# Patient Record
Sex: Male | Born: 1955 | Race: White | Hispanic: No | Smoking: Never smoker
Health system: Southern US, Community
[De-identification: ages and names within clinical notes are randomized; demographics above are authoritative.]

## PROBLEM LIST (undated history)

## (undated) DIAGNOSIS — B1920 Unspecified viral hepatitis C without hepatic coma: Secondary | ICD-10-CM

## (undated) DIAGNOSIS — D369 Benign neoplasm, unspecified site: Secondary | ICD-10-CM

## (undated) DIAGNOSIS — K449 Diaphragmatic hernia without obstruction or gangrene: Secondary | ICD-10-CM

## (undated) DIAGNOSIS — K222 Esophageal obstruction: Secondary | ICD-10-CM

## (undated) DIAGNOSIS — K5730 Diverticulosis of large intestine without perforation or abscess without bleeding: Secondary | ICD-10-CM

## (undated) DIAGNOSIS — I10 Essential (primary) hypertension: Secondary | ICD-10-CM

## (undated) DIAGNOSIS — K219 Gastro-esophageal reflux disease without esophagitis: Secondary | ICD-10-CM

## (undated) DIAGNOSIS — M1990 Unspecified osteoarthritis, unspecified site: Secondary | ICD-10-CM

## (undated) HISTORY — DX: Hereditary hemochromatosis: E83.110

## (undated) HISTORY — DX: Unspecified viral hepatitis C without hepatic coma: B19.20

## (undated) HISTORY — DX: Esophageal obstruction: K22.2

## (undated) HISTORY — PX: LIVER BIOPSY: SHX301

## (undated) HISTORY — DX: Diverticulosis of large intestine without perforation or abscess without bleeding: K57.30

## (undated) HISTORY — PX: VASECTOMY: SHX75

## (undated) HISTORY — DX: Benign neoplasm, unspecified site: D36.9

## (undated) HISTORY — DX: Diaphragmatic hernia without obstruction or gangrene: K44.9

---

## 2004-06-28 ENCOUNTER — Ambulatory Visit: Payer: Self-pay | Admitting: Internal Medicine

## 2004-07-01 ENCOUNTER — Ambulatory Visit: Payer: Self-pay | Admitting: Internal Medicine

## 2004-07-01 ENCOUNTER — Ambulatory Visit (HOSPITAL_COMMUNITY): Admission: RE | Admit: 2004-07-01 | Discharge: 2004-07-01 | Payer: Self-pay | Admitting: Internal Medicine

## 2004-07-01 HISTORY — PX: ESOPHAGOGASTRODUODENOSCOPY: SHX1529

## 2004-07-01 HISTORY — PX: COLONOSCOPY: SHX174

## 2004-07-02 ENCOUNTER — Ambulatory Visit: Payer: Self-pay | Admitting: Internal Medicine

## 2007-07-30 ENCOUNTER — Ambulatory Visit (HOSPITAL_COMMUNITY): Admission: RE | Admit: 2007-07-30 | Discharge: 2007-07-30 | Payer: Self-pay | Admitting: Internal Medicine

## 2007-07-30 ENCOUNTER — Encounter: Payer: Self-pay | Admitting: Internal Medicine

## 2007-07-30 ENCOUNTER — Ambulatory Visit: Payer: Self-pay | Admitting: Internal Medicine

## 2007-07-30 HISTORY — PX: COLONOSCOPY: SHX174

## 2008-06-20 ENCOUNTER — Emergency Department (HOSPITAL_COMMUNITY): Admission: EM | Admit: 2008-06-20 | Discharge: 2008-06-21 | Payer: Self-pay | Admitting: Emergency Medicine

## 2009-10-16 ENCOUNTER — Encounter: Payer: Self-pay | Admitting: Internal Medicine

## 2010-03-09 NOTE — Letter (Signed)
Summary: DISABILITY DETERMINATION  DISABILITY DETERMINATION   Imported By: Rexene Alberts 10/16/2009 13:45:12  _____________________________________________________________________  External Attachment:    Type:   Image     Comment:   External Document

## 2010-06-22 NOTE — H&P (Signed)
[name], Kyle               ACCOUNT NO.:  [id number]   MEDICAL RECORD NO.:  [id number]          PATIENT TYPE:  AMB   LOCATION:  DAY                           FACILITY:  [facility]   PHYSICIAN:  R. Roetta Sessions, M.D. DATE OF BIRTH:  Oct 31, 1955   DATE OF ADMISSION:  07/30/2007  DATE OF DISCHARGE:  LH                              HISTORY & PHYSICAL   Colonoscopy with snare polypectomy and polyp ablation.   INDICATIONS FOR PROCEDURE:  A 55 year old gentleman who has a history of  villous adenomas removed from his colon 3 years ago.  He is here for  surveillance.  He has no lower GI tract symptoms currently.   The patient has iron overload.  We have attempted to get him down to [facility]  for further evaluation of less than three times.  He has not been able  to make it.  He tells me he has been getting phlebotomies under the  direction of Dr. Margo Common.  Dr. Margo Common asked that we can go over with  things in that regard in the near future.  Today, he is undergoing  colonoscopy.  Risks, benefits, alternatives, and limitations have been  reviewed.  Questions answered and is agreeable.  Please see  documentation medical record.   PROCEDURE NOTE:  O2 saturation, blood pressure, pulse, respirations were  monitor throughout the entire procedure.   CONSCIOUS SEDATION:  Versed 5 mg IV and Demerol 75 mg IV, in divided  doses.   INSTRUMENT:  Pentax video chip system.   FINDINGS:  Digital rectal exam revealed no abnormalities.  Endoscopic  findings:  The prep was adequate.  Colon:  Colonic mucosa was surveyed  from the rectosigmoid junction to the left transverse, right colon,  appendiceal orifice, ileocecal valve, and cecum.  From this level, the  scope was slowly withdrawn.  All previous mentioned mucosal surfaces  were again seen.  The patient had an extensive diverticula tapering off  well into the ascending colon.  The diverticula more pronounced on the  left colon.  The patient had a 1 cm polyp  on a long stalk in the mid  ascending colon, which was hot snare removed.  There is a second  diminutive polyp, which was ablated with a tip of the snare cautery in  it in the same segment.  There was a 5 mm polyp in the mid descending,  which was hot snared and finally a diminutive polyp in the mid  descending colon, which was ablated with a tip of the hot snare cautery  in it.  The remaining colonic mucosa appeared normal.  The scope was  pulled down the rectum.  A thorough examination of rectal mucosa  including retroflexion view of the anal verge demonstrated no  abnormalities.  The patient tolerated the procedure well and was reacted  in endoscopy.   IMPRESSION:  1. Normal rectum, pancolonic diverticula.  2. Polyps.  Colonic polyps, as described above, removed and/or ablated      as described above.  Large polyp was a 1 cm polyp in the mid  descending colon.   RECOMMENDATIONS:  1. Diverticulosis.  Polyp literature provided [name].  2. Follow up on path.  3. We will make arrangements to get him back in the office where we      will make further recommendations regarding hemochromatosis.      [name], M.D.  Electronically Signed     RMR/MEDQ  D:  07/30/2007  T:  07/30/2007  Job:  [id number]   cc:   Wyvonnia Lora  Fax: [phone]

## 2010-06-25 NOTE — Op Note (Signed)
NAMEPAULINO, Kyle [name]               ACCOUNT NO.:  [id number]   MEDICAL RECORD NO.:  [id number]          PATIENT TYPE:  AMB   LOCATION:  DAY                           FACILITY:  [facility]   PHYSICIAN:  R. Roetta Sessions, M.D. DATE OF BIRTH:  1955-04-28   DATE OF PROCEDURE:  07/01/2004  DATE OF DISCHARGE:                                 OPERATIVE REPORT   PROCEDURE PERFORMED:  Esophagogastroduodenoscopy diagnostic followed by  colonoscopy with snare polypectomy and biopsy.   INDICATIONS FOR PROCEDURE:  The patient is a 55 year old gentleman with new  onset iron deficiency anemia.  Esophagogastroduodenoscopy and colonoscopy  are now being done.  This approach has been discussed with the patient at  length, potential risks, benefits and alternatives have been reviewed and  questions answered.  Please see documentation in the medical record from Jun 28, 2004 consultation.   PROCEDURE NOTE:  Oxygen saturations, blood pressure, pulse and respirations  were monitored throughout the entirety of the procedure.   CONSCIOUS SEDATION:  Versed 5 mg IV, Demerol 100 mg IV in divided doses.  Cetacaine spray for topical pharyngeal anesthesia.   INSTRUMENT USED:  Olympus video chip system.   FINDINGS:  EGD:  Examination of the tubular esophagus revealed a noncritical  Schatzke's ring.  Otherwise esophageal mucosa appeared normal.  It was  easily traversed.  Esophagogastric junction was easily traversed.   Stomach:  The gastric cavity was emptied and insufflated well with air.  Thorough examination of the gastric mucosa including retroflex view of the  proximal stomach, esophagogastric junction demonstrated only a moderate-  sized hiatal hernia.  The mucosa of the stomach otherwise appeared entirely  normal.  Pylorus was patent and easily traversed.  Examination of the bulb,  second and third portion revealed no abnormalities.   THERAPY/DIAGNOSTIC MANEUVERS PERFORMED:  None.  The patient tolerated  the  procedure well was prepared for colonoscopy.   Digital rectal exam revealed no abnormalities.   ENDOSCOPIC FINDINGS:  Prep was adequate.   Rectum:  Examination of rectal mucosa including retroflex view of the anal  verge revealed a pedunculated somewhat ulcerated polyp at 15 cm from the  anal verge approximately 1.5 to 2 cm in dimensions.  Please see photos.  It  was oozing.  There was some blood tinged mucosa around this lesion.   Colon:  The colonic mucosa was surveyed from the rectosigmoid junction to  the left, transverse, right colon to area of appendiceal orifice, ileocecal  valve and cecum.  These structures were well seen and photographed for the  record.  From this level, the scope was slowly withdrawn.  All previously  mentioned mucosal surfaces were again seen.  The patient was noted to have  scattered sigmoid diverticula.  There was a 7 mm polyp on a stalk just  distal to the ileocecal valve that was removed with snare cautery and  recovered through the scope.  There was a diminutive 4 mm polyp at the  splenic flexure which was cold biopsied/removed and finally there was a 7 mm  polyp in at 35 cm on a stalk  which was removed with snare cautery and  recovered.  The patient tolerated both procedures well, was reacted in  endoscopy.   IMPRESSION:  Esophagogastroduodenoscopy:  Normal esophagus aside from  noncritical Schatzke's ring, not manipulated.  Moderate sized hiatal hernia,  otherwise normal stomach, normal D1, D2.   Colonoscopy: Pedunculated rectal polyp (bleeding) status post snare  polypectomy, left-sided diverticula.  Multiple colonic polyps removed with  cold biopsy and or snare cautery as described above.   DISCUSSION:  The large polyp in the rectum over time may be a major  contributor to this gentleman's iron deficiency anemia picture and may have  fortuitously afforded him some transient protection again iron overload.   RECOMMENDATIONS:  1.  No  aspirin or arthritis medications for the next 10 days.  2.  Follow-up on pathology.  3.  Further recommendations to follow.  4.  Will reactivate referral to the [facility] hepatology      clinic in [place].  This will be our third attempt at getting  [facility]      down there.  I am emphasized the importance of compliance and follow-up      with this referral.      RMR/MEDQ  D:  07/01/2004  T:  07/01/2004  Job:  [id number]   cc:   Wyvonnia Lora  [address]  [place]  [place] [address]  Fax: [phone]

## 2010-07-16 ENCOUNTER — Encounter: Payer: Self-pay | Admitting: Internal Medicine

## 2011-02-08 HISTORY — PX: SPLENECTOMY, TOTAL: SHX788

## 2011-02-08 HISTORY — PX: EXPLORATORY LAPAROTOMY: SUR591

## 2011-09-27 ENCOUNTER — Telehealth: Payer: Self-pay | Admitting: *Deleted

## 2011-09-27 ENCOUNTER — Ambulatory Visit: Payer: Self-pay | Admitting: Internal Medicine

## 2011-09-27 NOTE — Telephone Encounter (Signed)
Pt was a no show

## 2012-06-11 ENCOUNTER — Encounter: Payer: Self-pay | Admitting: Gastroenterology

## 2012-06-11 ENCOUNTER — Ambulatory Visit (INDEPENDENT_AMBULATORY_CARE_PROVIDER_SITE_OTHER): Payer: Medicare Other | Admitting: Gastroenterology

## 2012-06-11 ENCOUNTER — Other Ambulatory Visit: Payer: Self-pay | Admitting: Internal Medicine

## 2012-06-11 VITALS — BP 129/88 | HR 63 | Temp 98.2°F | Ht 71.0 in | Wt 223.4 lb

## 2012-06-11 DIAGNOSIS — Z86010 Personal history of colonic polyps: Secondary | ICD-10-CM | POA: Insufficient documentation

## 2012-06-11 DIAGNOSIS — B1920 Unspecified viral hepatitis C without hepatic coma: Secondary | ICD-10-CM

## 2012-06-11 DIAGNOSIS — B182 Chronic viral hepatitis C: Secondary | ICD-10-CM

## 2012-06-11 DIAGNOSIS — Z860101 Personal history of adenomatous and serrated colon polyps: Secondary | ICD-10-CM | POA: Insufficient documentation

## 2012-06-11 MED ORDER — PEG 3350-KCL-NA BICARB-NACL 420 G PO SOLR: 4000.0000 mL | ORAL | Status: DC

## 2012-06-11 NOTE — Progress Notes (Signed)
CC PCP 

## 2012-06-11 NOTE — Assessment & Plan Note (Signed)
Due for surveillance colonoscopy given history of multiple tubulovillous adenomas removed from the colon in the past. He has a history of intermittent heavy alcohol use therefore we'll augment conscious sedation with Phenergan 25 mg IV 30 minutes before the procedure. He has not drank in about 6 months.  I have discussed the risks, alternatives, benefits with regards to but not limited to the risk of reaction to medication, bleeding, infection, perforation and the patient is agreeable to proceed. Written consent to be obtained.

## 2012-06-11 NOTE — Assessment & Plan Note (Addendum)
Trying to obtain old records. Previously failed treatment in 2003. Currently he is not drinking alcohol. Given his history of hereditary hemochromatosis, he should consider retreatment with numerous agents to decrease his chance of ongoing liver damage. We will refer him to the hepatitis C clinic in [place]. Patient is in agreement with the plan.

## 2012-06-11 NOTE — Patient Instructions (Addendum)
1. Colonoscopy with Dr. Jena Gauss in the near future. Please see separate instructions 2. Referral to hepatitis C clinic in [place]. If you do not have an appointment time/date within the next 2 weeks please let us know. 3. Please advise her daughters they should be tested for hemachromatosis genetic markers.  Referral has been sent to the Hep C Clinic

## 2012-06-11 NOTE — Assessment & Plan Note (Addendum)
Trying to obtain old records. Recent ferritin and iron level were okay. Last phlebotomy reportedly 2 years ago. He was advised to have his daughters tested for hemachromatosis via genetic markers.
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
## 2012-06-12 LAB — CBC
Ferritin: 56
HCV Quantitative: 2000000
WBC: 9.3

## 2012-06-12 LAB — COMPREHENSIVE METABOLIC PANEL
ALT: 90 U/L — AB (ref 10–40)
Alkaline Phosphatase: 112 U/L
Total Bilirubin: 0.7 mg/dL

## 2012-06-22 ENCOUNTER — Telehealth: Payer: Self-pay | Admitting: Internal Medicine

## 2012-06-22 NOTE — Telephone Encounter (Signed)
Pt called to let us know that he received a call from a [facility] in [place] and he wants his referral in [place]. He was seen recently by [facility]. He said we could call [phone] to set his referral up there. Any questions call him at [phone]

## 2012-06-22 NOTE — Telephone Encounter (Signed)
Referral has been faxed to [facility]

## 2012-06-27 ENCOUNTER — Encounter (HOSPITAL_COMMUNITY): Payer: Self-pay

## 2012-06-27 ENCOUNTER — Encounter (HOSPITAL_COMMUNITY)
Admission: RE | Disposition: A | Discharge status: Home / Self Care | Payer: Self-pay | Source: Ambulatory Visit | Attending: Internal Medicine

## 2012-06-27 ENCOUNTER — Ambulatory Visit (HOSPITAL_COMMUNITY)
Admission: RE | Admit: 2012-06-27 | Discharge: 2012-06-27 | Disposition: A | Discharge status: Home / Self Care | Payer: Medicare Other | Source: Ambulatory Visit | Attending: Internal Medicine | Admitting: Internal Medicine

## 2012-06-27 DIAGNOSIS — D126 Benign neoplasm of colon, unspecified: Secondary | ICD-10-CM | POA: Insufficient documentation

## 2012-06-27 DIAGNOSIS — Z860100 Personal history of colon polyps, unspecified: Secondary | ICD-10-CM

## 2012-06-27 DIAGNOSIS — K5730 Diverticulosis of large intestine without perforation or abscess without bleeding: Secondary | ICD-10-CM

## 2012-06-27 DIAGNOSIS — Z1211 Encounter for screening for malignant neoplasm of colon: Secondary | ICD-10-CM

## 2012-06-27 DIAGNOSIS — B1920 Unspecified viral hepatitis C without hepatic coma: Secondary | ICD-10-CM

## 2012-06-27 HISTORY — PX: COLONOSCOPY: SHX5424

## 2012-06-27 SURGERY — COLONOSCOPY
Anesthesia: Moderate Sedation

## 2012-06-27 MED ORDER — SODIUM CHLORIDE 0.9 % IJ SOLN
INTRAMUSCULAR | Status: AC
  Filled 2012-06-27: qty 10

## 2012-06-27 MED ORDER — PROMETHAZINE HCL 25 MG/ML IJ SOLN
INTRAMUSCULAR | Status: AC
  Filled 2012-06-27: qty 1

## 2012-06-27 MED ORDER — PROMETHAZINE HCL 25 MG/ML IJ SOLN
25.0000 mg | Freq: Once | INTRAMUSCULAR | Status: AC
  Administered 2012-06-27: 25 mg via INTRAVENOUS

## 2012-06-27 MED ORDER — MIDAZOLAM HCL 5 MG/5ML IJ SOLN
INTRAMUSCULAR | Status: AC
  Filled 2012-06-27: qty 10

## 2012-06-27 MED ORDER — STERILE WATER FOR IRRIGATION IR SOLN
Status: DC | PRN
  Administered 2012-06-27: 10:00:00

## 2012-06-27 MED ORDER — ONDANSETRON HCL 4 MG/2ML IJ SOLN
INTRAMUSCULAR | Status: AC
  Filled 2012-06-27: qty 2

## 2012-06-27 MED ORDER — MEPERIDINE HCL 100 MG/ML IJ SOLN
INTRAMUSCULAR | Status: AC
  Filled 2012-06-27: qty 1

## 2012-06-27 MED ORDER — MEPERIDINE HCL 100 MG/ML IJ SOLN
INTRAMUSCULAR | Status: DC | PRN
  Administered 2012-06-27: 25 mg via INTRAVENOUS
  Administered 2012-06-27: 50 mg via INTRAVENOUS

## 2012-06-27 MED ORDER — ONDANSETRON HCL 4 MG/2ML IJ SOLN
INTRAMUSCULAR | Status: DC | PRN
  Administered 2012-06-27: 4 mg via INTRAVENOUS

## 2012-06-27 MED ORDER — SODIUM CHLORIDE 0.9 % IV SOLN
INTRAVENOUS | Status: DC
  Administered 2012-06-27: 10:00:00 via INTRAVENOUS

## 2012-06-27 MED ORDER — MIDAZOLAM HCL 5 MG/5ML IJ SOLN
INTRAMUSCULAR | Status: DC | PRN
  Administered 2012-06-27 (×2): 2 mg via INTRAVENOUS

## 2012-06-27 NOTE — Interval H&P Note (Signed)
History and Physical Interval Note:  06/27/2012 10:22 AM  Kyle [name]  has presented today for surgery, with the diagnosis of HCV, HISTORY OF COLON POLYPS, Hereditary hemochromatosis  The various methods of treatment have been discussed with the patient and family. After consideration of risks, benefits and other options for treatment, the patient has consented to  Procedure(s) with comments: COLONOSCOPY (N/A) - 10:15 as a surgical intervention .  The patient's history has been reviewed, patient examined, no change in status, stable for surgery.  I have reviewed the patient's chart and labs.  Questions were answered to the patient's satisfaction.     Eula Listen  Colonoscopy today per plan.The risks, benefits, limitations, alternatives and imponderables have been reviewed with the patient. Questions have been answered. All parties are agreeable.

## 2012-06-27 NOTE — Op Note (Signed)
[facility] [address] [place], [address]   COLONOSCOPY PROCEDURE REPORT  PATIENT: Kyle [name], Kyle [name]  MR#:         [id number] BIRTHDATE: 01-Mar-1955 , 56  yrs. old GENDER: Male ENDOSCOPIST: R.  Roetta Sessions, MD FACP FACG REFERRED BY:  Wyvonnia Lora, M.D. PROCEDURE DATE:  06/27/2012 PROCEDURE:     Colonoscopy with snare polypectomy  INDICATIONS: history of high-grade adenomas; overdue for surveillance exam  INFORMED CONSENT:  The risks, benefits, alternatives and imponderables including but not limited to bleeding, perforation as well as the possibility of a missed lesion have been reviewed.  The potential for biopsy, lesion removal, etc. have also been discussed.  Questions have been answered.  All parties agreeable. Please see the history and physical in the medical record for more information.  MEDICATIONS: Versed 4 mg IV and Demerol 75 mg IV in divided doses. Phenergan 25 mg IV. Zofran 4 mg IV.  DESCRIPTION OF PROCEDURE:  After a digital rectal exam was performed, the EC-[id number] ([id number])  colonoscope was advanced from the anus through the rectum and colon to the area of the cecum, ileocecal valve and appendiceal orifice.  The cecum was deeply intubated.  These structures were well-seen and photographed for the record.  From the level of the cecum and ileocecal valve, the scope was slowly and cautiously withdrawn.  The mucosal surfaces were carefully surveyed utilizing scope tip deflection to facilitate fold flattening as needed.  The scope was pulled down into the rectum where a thorough examination including retroflexion was performed.    FINDINGS:  Inadequate preparation as far as polyp detection is concerned.  Normal rectum. Left-sided and transverse diverticula. (2) 4-5 mm polyps in the mid transverse segment; otherwise, the remainder of the colonic mucosa that was seen appeared normal.  THERAPEUTIC / DIAGNOSTIC MANEUVERS PERFORMED:  The 2  above-mentioned polyps were hot snared/removed.  COMPLICATIONS: None  CECAL WITHDRAWAL TIME:  7 minutes  IMPRESSION:  Colonic diverticulosis. Colonic polyps-removed as described above. Inadequate prep  RECOMMENDATIONS: Followup on pathology. Further recommendations to follow.   _______________________________ eSigned:  R. Roetta Sessions, MD FACP [facility] 06/27/2012 11:01 AM   CC:
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
## 2012-06-29 ENCOUNTER — Encounter (HOSPITAL_COMMUNITY): Payer: Self-pay | Admitting: Internal Medicine

## 2012-07-03 ENCOUNTER — Encounter: Payer: Self-pay | Admitting: Internal Medicine

## 2012-07-18 ENCOUNTER — Telehealth: Payer: Self-pay | Admitting: Gastroenterology

## 2012-07-18 DIAGNOSIS — B182 Chronic viral hepatitis C: Secondary | ICD-10-CM

## 2012-07-18 NOTE — Telephone Encounter (Signed)
Reviewed our old records that were in storage.  Hereditary hemochromatosis, homozygous for C282Y gene.  Has had IDA, reason for TCS/EGD in 2006. Requiring transfusion in the past.  Hepatitis C, genotype 1A, inadequate response to combination ribavirin and pegylated interferon in 2003.  2 liver biopsies in the past. 1995, 2001 2001 biopsy there was mention of marked reduction in iron compared to the 1995 biopsy. There was also less fibrosis. In 2001, stage II  Fibrosis.  What is status of Hepatitis Clinic referral? Patient needs to have repeat CBC, iron and TIBC, ferritin in 3 months.

## 2012-07-18 NOTE — Telephone Encounter (Signed)
Referral was faxed the first time to the Hep C clinic in [place] because we had old referral forms, the patient refused to go to [place] and we got the correct referral form from [facility] and have faxed in a new referral to them

## 2012-07-18 NOTE — Telephone Encounter (Signed)
Lab order on file. Kyle [name], please check on referral

## 2012-08-14 ENCOUNTER — Telehealth: Payer: Self-pay | Admitting: Gastroenterology

## 2012-08-14 NOTE — Telephone Encounter (Signed)
Patient is scheduled at the [facility] in [place] on Wednesday August 27th 2014 at 1:30

## 2012-10-01 ENCOUNTER — Other Ambulatory Visit: Payer: Self-pay

## 2012-10-01 DIAGNOSIS — B182 Chronic viral hepatitis C: Secondary | ICD-10-CM

## 2012-10-03 ENCOUNTER — Telehealth: Payer: Self-pay | Admitting: Internal Medicine

## 2012-10-03 ENCOUNTER — Other Ambulatory Visit: Payer: Self-pay | Admitting: Internal Medicine

## 2012-10-03 DIAGNOSIS — B182 Chronic viral hepatitis C: Secondary | ICD-10-CM

## 2012-10-03 NOTE — Telephone Encounter (Signed)
Pt received letter about having blood work done and he said that he had 8 valves drawn recently and we could probably get what we needed from that. I told him I wasn't sure what all had been ordered and that I would let the nurse be aware. Any questions you can call [phone]

## 2012-10-09 NOTE — Telephone Encounter (Signed)
Tried to call pt- LMOM 

## 2012-10-10 ENCOUNTER — Ambulatory Visit
Admission: RE | Admit: 2012-10-10 | Discharge: 2012-10-10 | Disposition: A | Discharge status: Home / Self Care | Payer: Medicare Other | Source: Ambulatory Visit | Attending: Internal Medicine | Admitting: Internal Medicine

## 2012-10-10 DIAGNOSIS — B182 Chronic viral hepatitis C: Secondary | ICD-10-CM

## 2012-10-15 NOTE — Telephone Encounter (Signed)
Tried to call pt- LMOM 

## 2012-10-24 NOTE — Telephone Encounter (Signed)
Tried to call pt- Kyle [name]- LM on voicemail, asked him to ask the doctor that drew the blood work that he had done recently and have them fax [place] the results to see if any of it was the same labs that we needed.

## 2012-10-24 NOTE — Telephone Encounter (Signed)
Routing to [facility] for FYI 

## 2012-10-25 NOTE — Telephone Encounter (Signed)
Patient two of the fax received from [facility] was missing. I also did not get the labs. Please request again.

## 2012-10-26 NOTE — Telephone Encounter (Signed)
Kyle [name], please request labs again. 

## 2012-10-29 NOTE — Telephone Encounter (Signed)
Requested.

## 2012-10-29 NOTE — Telephone Encounter (Signed)
Records given to Leslie Lewis 

## 2012-11-02 ENCOUNTER — Encounter: Payer: Self-pay | Admitting: Gastroenterology

## 2012-11-02 NOTE — Progress Notes (Signed)
Patient ID: TARQUIN WELCHER, male   DOB: [birth year], 57 y.o.   MRN: [id number]   Received labs from [facility] liver [facility].  Labs from 10/03/2012. HCV RNA positive. White blood cell count 8200, hemoglobin 16.7, hematocrit 47.1, MCV 95.5, platelets 3 and 47,000, INR 1.04, glucose 101, BUN 18, creatinine 1.02, total bilirubin 0.7, alkaline phosphatase 88, AST 119, ALT 108, albumin 3.6, ferritin 156, hepatitis B surface antigen negative, hepatitis B surface antibody negative, hepatitis A antibody total negative, AFP 6.8.  Please let patient know that we were able to obtain a copy of all his lab work done at the [facility] liver clinic. Regarding his hemachromatosis. His ferritin level was 156. It is up from 56 back in March.

## 2012-11-06 ENCOUNTER — Other Ambulatory Visit: Payer: Self-pay | Admitting: Gastroenterology

## 2012-11-06 LAB — COMPREHENSIVE METABOLIC PANEL
Alkaline Phosphatase: 88 U/L
BUN: 18 mg/dL (ref 4–21)
Creat: 1.02
Glucose: 101
Total Bilirubin: 0.7 mg/dL

## 2012-11-06 LAB — CBC
HCT: 47 %
HGB: 16.7 g/dL
INR: 1
MCV: 95.5 fL
platelet count: 347

## 2012-11-06 NOTE — Progress Notes (Signed)
Kyle [name], please check the orders on this phlebotomy.

## 2012-11-06 NOTE — Progress Notes (Signed)
Spoke with Arline Asp at Dr. Jackolyn Confer office- she knows this pt very well, she said they used to do standing orders for phlebotomies on him. She spoke with the lab tech at the [facility], which is next door to their office. Their first available appt for him is 11/22/12 at 12 noon. Pt will need to have a cbc drawn at the hospital the day before because the lab tech will need an H&H on him before she can start phlebotomy. Lab order faxed to [facility] lab for a cbc. They will need an order for the phlebotomy faxed to [phone]. I called pt and he is aware of his appt and he is aware that he needs to go to lab on the day before for cbc.

## 2012-11-06 NOTE — Progress Notes (Addendum)
Patient ID: Kyle [name], male   DOB: 16-Jan-1956, 57 y.o.   MRN: [id number]  Please let patient know he should undergo phlebotomy based on current [facility] guidelines.   Please schedule him for removal of 500cc of blood at agency of his choice. Given his HCV he should not donate blood.  Repeat CBC, Iron/TIBC, ferritin in two months.

## 2012-11-06 NOTE — Progress Notes (Signed)
Pt is aware- he wants me to check with Dr. Jackolyn Confer office to see if the phlebotomy can be done there. I called Dr. Jackolyn Confer office and left a voicemail with his nurse and asked her to call me back to let me know if they can do it. Future lab order on file.

## 2012-11-07 ENCOUNTER — Other Ambulatory Visit: Payer: Self-pay

## 2012-11-07 NOTE — Progress Notes (Signed)
Orders for phlebotomy sent to [facility] at Dr. Hillery Jacks office.

## 2012-12-04 ENCOUNTER — Other Ambulatory Visit: Payer: Self-pay

## 2012-12-10 ENCOUNTER — Telehealth: Payer: Self-pay | Admitting: Gastroenterology

## 2012-12-10 NOTE — Telephone Encounter (Signed)
Patient had phlebotomy performed on 11/21/2012. He needs to have repeat hemoglobin and hematocrit, iron/TIBC, ferritin in 2 weeks to determine if he needs another phlebotomy.

## 2012-12-11 ENCOUNTER — Other Ambulatory Visit: Payer: Self-pay

## 2012-12-11 NOTE — Telephone Encounter (Signed)
Letter and lab orders mailed to pt. 

## 2012-12-21 ENCOUNTER — Telehealth: Payer: Self-pay | Admitting: Gastroenterology

## 2012-12-21 NOTE — Telephone Encounter (Signed)
Labs dated 12/14/2012  Iron 200, TIBC 269, saturation 74%, ferritin 99.3, hemoglobin 16.4, hematocrit 48.8.  Please let patient know that his ferritin is improved but still too elevated for proper management of hemochromatosis.  Please arrange for another two more phlebotomies with removal of 500 cc of blood four weeks apart. He will need to have an H&H today prior to his phlebotomy.  Repeat iron/TIBC, ferritin, H/H 4 weeks after second phlebotomy.  See me if any questions.

## 2012-12-27 ENCOUNTER — Other Ambulatory Visit: Payer: Self-pay

## 2012-12-27 ENCOUNTER — Other Ambulatory Visit: Payer: Self-pay | Admitting: Gastroenterology

## 2012-12-27 NOTE — Telephone Encounter (Signed)
Orders for phlebotomy and H&H have been faxed to [facility] lab.

## 2012-12-27 NOTE — Telephone Encounter (Signed)
Future blood work orders are on file.

## 2012-12-27 NOTE — Telephone Encounter (Signed)
Lab orders done. Phlebotomy orders on [facility] desk to be signed.

## 2012-12-27 NOTE — Telephone Encounter (Signed)
Pt is aware.  

## 2012-12-31 LAB — CBC
HCT: 49 %
HGB: 16.4 g/dL
Iron: 200
TIBC: 269

## 2013-01-04 ENCOUNTER — Encounter: Payer: Self-pay | Admitting: Gastroenterology

## 2013-01-16 ENCOUNTER — Telehealth: Payer: Self-pay | Admitting: Internal Medicine

## 2013-01-16 NOTE — Telephone Encounter (Signed)
Pt called to get his lab orders sent to [facility] instead of [facility] and asked for a return call to [phone]

## 2013-01-23 NOTE — Telephone Encounter (Signed)
New orders have been done and on [facility] cart to be signed. LMOM for pt that I would send orders to [facility] and they would call him with appt.

## 2013-01-23 NOTE — Telephone Encounter (Signed)
Tried to call pt- LMOM 

## 2013-01-24 NOTE — Telephone Encounter (Signed)
Orders done

## 2013-01-24 NOTE — Telephone Encounter (Signed)
Tried to call pt- LMOM 

## 2013-01-29 NOTE — Telephone Encounter (Signed)
Orders have been faxed to [facility]

## 2013-01-30 ENCOUNTER — Telehealth: Payer: Self-pay | Admitting: *Deleted

## 2013-01-30 NOTE — Telephone Encounter (Signed)
Pt called to let us know he is getting his labs done 12-30. Pt has already has some done at [facility]

## 2013-02-06 ENCOUNTER — Encounter (HOSPITAL_COMMUNITY)
Admission: RE | Admit: 2013-02-06 | Discharge: 2013-02-06 | Disposition: A | Discharge status: Home / Self Care | Payer: Medicare Other | Source: Ambulatory Visit | Attending: Gastroenterology | Admitting: Gastroenterology

## 2013-02-06 NOTE — Progress Notes (Signed)
Kyle [name] presents today for phlebotomy per MD orders. HGB/HCT:15.6/44.8% Phlebotomy procedure started at 0936 and ended at 0942. 500 cc removed. Patient tolerated procedure well. IV needle removed intact.

## 2013-02-28 ENCOUNTER — Other Ambulatory Visit: Payer: Self-pay

## 2013-03-06 ENCOUNTER — Other Ambulatory Visit (HOSPITAL_COMMUNITY): Payer: Medicare Other

## 2013-03-06 ENCOUNTER — Encounter (HOSPITAL_COMMUNITY): Payer: Medicare Other

## 2013-03-07 ENCOUNTER — Encounter (HOSPITAL_COMMUNITY): Admission: RE | Admit: 2013-03-07 | Payer: Medicare Other | Source: Ambulatory Visit

## 2013-03-07 ENCOUNTER — Encounter (HOSPITAL_COMMUNITY): Payer: Medicare Other

## 2013-03-07 LAB — CBC
HEMATOCRIT: 47 %
HEMOGLOBIN: 15.8 g/dL

## 2013-03-11 ENCOUNTER — Encounter (HOSPITAL_COMMUNITY): Payer: Medicare Other

## 2013-03-11 ENCOUNTER — Encounter (HOSPITAL_COMMUNITY): Admission: RE | Admit: 2013-03-11 | Payer: Medicare Other | Source: Ambulatory Visit

## 2013-03-15 ENCOUNTER — Encounter (HOSPITAL_COMMUNITY)
Admission: RE | Admit: 2013-03-15 | Discharge: 2013-03-15 | Disposition: A | Discharge status: Home / Self Care | Payer: Medicare Other | Source: Ambulatory Visit | Attending: Gastroenterology | Admitting: Gastroenterology

## 2013-03-15 DIAGNOSIS — B182 Chronic viral hepatitis C: Secondary | ICD-10-CM | POA: Diagnosis not present

## 2013-03-15 DIAGNOSIS — Z09 Encounter for follow-up examination after completed treatment for conditions other than malignant neoplasm: Secondary | ICD-10-CM | POA: Diagnosis not present

## 2013-03-15 LAB — HEMOGLOBIN AND HEMATOCRIT, BLOOD
HEMATOCRIT: 44.4 % (ref 39.0–52.0)
Hemoglobin: 15.4 g/dL (ref 13.0–17.0)

## 2013-03-15 NOTE — Progress Notes (Addendum)
0900 pt arrived for therapeutic phlebotomy. Blood drawn for H&H (hgb 15.4 ). Procedure started at 0915 with blood obtained from right ac. Stopped at [phone]. Tolerated well. Drinking soda, eating crackers post procedure. Blood bag weighed 1.4 lbs.

## 2013-03-27 ENCOUNTER — Other Ambulatory Visit: Payer: Self-pay

## 2013-03-27 DIAGNOSIS — Z86010 Personal history of colonic polyps: Secondary | ICD-10-CM

## 2013-03-27 DIAGNOSIS — B182 Chronic viral hepatitis C: Secondary | ICD-10-CM

## 2013-04-08 NOTE — Progress Notes (Signed)
Kyle [name] called [facility] and cancelled Therapeutic  phlebotomy appt.Marland Kitchen "Will call when needs another"

## 2013-04-11 ENCOUNTER — Inpatient Hospital Stay (HOSPITAL_COMMUNITY): Admission: RE | Admit: 2013-04-11 | Payer: Medicare Other | Source: Ambulatory Visit

## 2013-04-23 ENCOUNTER — Ambulatory Visit (INDEPENDENT_AMBULATORY_CARE_PROVIDER_SITE_OTHER): Payer: Medicare Other | Admitting: Internal Medicine

## 2013-04-23 ENCOUNTER — Encounter: Payer: Self-pay | Admitting: Internal Medicine

## 2013-04-23 ENCOUNTER — Telehealth: Payer: Self-pay

## 2013-04-23 ENCOUNTER — Encounter (INDEPENDENT_AMBULATORY_CARE_PROVIDER_SITE_OTHER): Payer: Self-pay

## 2013-04-23 VITALS — BP 131/91 | HR 90 | Temp 98.3°F | Wt 220.0 lb

## 2013-04-23 DIAGNOSIS — B182 Chronic viral hepatitis C: Secondary | ICD-10-CM

## 2013-04-23 LAB — CBC WITH DIFFERENTIAL/PLATELET
Basophils Absolute: 0.1 10*3/uL (ref 0.0–0.1)
Basophils Relative: 1 % (ref 0–1)
Eosinophils Absolute: 0.1 10*3/uL (ref 0.0–0.7)
Eosinophils Relative: 1 % (ref 0–5)
HCT: 41 % (ref 39.0–52.0)
HEMOGLOBIN: 13.7 g/dL (ref 13.0–17.0)
LYMPHS ABS: 3.6 10*3/uL (ref 0.7–4.0)
Lymphocytes Relative: 45 % (ref 12–46)
MCH: 29.8 pg (ref 26.0–34.0)
MCHC: 33.4 g/dL (ref 30.0–36.0)
MCV: 89.1 fL (ref 78.0–100.0)
Monocytes Absolute: 1 10*3/uL (ref 0.1–1.0)
Monocytes Relative: 13 % — ABNORMAL HIGH (ref 3–12)
NEUTROS ABS: 3.2 10*3/uL (ref 1.7–7.7)
NEUTROS PCT: 40 % — AB (ref 43–77)
Platelets: 370 10*3/uL (ref 150–400)
RBC: 4.6 MIL/uL (ref 4.22–5.81)
RDW: 14 % (ref 11.5–15.5)
WBC: 8 10*3/uL (ref 4.0–10.5)

## 2013-04-23 NOTE — Patient Instructions (Addendum)
GET twin Rx from [facility] department ASAP  HCV RNA assay, serum ferritin, CBC and chem-12  Our office will check into getting precertification top prescribe Harvoni  Further recommendations to follow

## 2013-04-23 NOTE — Telephone Encounter (Signed)
Per RMR- We need to retrieve the patient's liver biopsy genotyping and last viral load assay
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
## 2013-04-23 NOTE — Telephone Encounter (Signed)
Kyle [name], please get records. I think Liver biopsy was done at [facility]. Last Hep c viral load was done at the liver clinic.

## 2013-04-24 LAB — COMPREHENSIVE METABOLIC PANEL
ALBUMIN: 3.5 g/dL (ref 3.5–5.2)
ALT: 72 U/L — AB (ref 0–53)
AST: 148 U/L — ABNORMAL HIGH (ref 0–37)
Alkaline Phosphatase: 85 U/L (ref 39–117)
BUN: 11 mg/dL (ref 6–23)
CO2: 28 meq/L (ref 19–32)
Calcium: 9.5 mg/dL (ref 8.4–10.5)
Chloride: 100 mEq/L (ref 96–112)
Creat: 1.08 mg/dL (ref 0.50–1.35)
Glucose, Bld: 101 mg/dL — ABNORMAL HIGH (ref 70–99)
POTASSIUM: 4.4 meq/L (ref 3.5–5.3)
SODIUM: 138 meq/L (ref 135–145)
TOTAL PROTEIN: 7.4 g/dL (ref 6.0–8.3)
Total Bilirubin: 1.1 mg/dL (ref 0.2–1.2)

## 2013-04-24 LAB — FERRITIN: FERRITIN: 60 ng/mL (ref 22–322)

## 2013-04-24 LAB — HEPATITIS C RNA QUANTITATIVE
HCV Quantitative Log: 6.67 {Log} — ABNORMAL HIGH (ref <1.18)
HCV Quantitative: 4634021 IU/mL — ABNORMAL HIGH (ref <15)

## 2013-04-24 NOTE — Telephone Encounter (Signed)
I mailed pt a sign of release form so we can get these records.

## 2013-05-01 NOTE — Telephone Encounter (Signed)
Requested Records.  

## 2013-05-14 ENCOUNTER — Encounter: Payer: Self-pay | Admitting: Internal Medicine

## 2013-05-14 NOTE — Telephone Encounter (Signed)
[facility] does not have any records for this pt.

## 2013-05-15 NOTE — Telephone Encounter (Signed)
Records are in Dr. Coralee North cart from [place].

## 2013-06-17 ENCOUNTER — Ambulatory Visit (INDEPENDENT_AMBULATORY_CARE_PROVIDER_SITE_OTHER): Payer: Medicare Other | Admitting: Gastroenterology

## 2013-06-17 ENCOUNTER — Encounter: Payer: Self-pay | Admitting: Gastroenterology

## 2013-06-17 ENCOUNTER — Encounter (INDEPENDENT_AMBULATORY_CARE_PROVIDER_SITE_OTHER): Payer: Self-pay

## 2013-06-17 VITALS — BP 125/87 | HR 63 | Temp 97.6°F | Ht 69.0 in | Wt 217.0 lb

## 2013-06-17 DIAGNOSIS — Z86010 Personal history of colonic polyps: Secondary | ICD-10-CM | POA: Diagnosis not present

## 2013-06-17 DIAGNOSIS — B182 Chronic viral hepatitis C: Secondary | ICD-10-CM | POA: Diagnosis not present

## 2013-06-17 MED ORDER — PEG 3350-KCL-NA BICARB-NACL 420 G PO SOLR: 4000.0000 mL | ORAL | Status: DC
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
## 2013-06-17 NOTE — Progress Notes (Signed)
cc'd to pcp 

## 2013-06-17 NOTE — Patient Instructions (Signed)
1. Colonoscopy is scheduled. Please see separate instructions. 2. I will followup on status of approval for hepatitis C treatment and look into trying to get approval for hepatitis A and B vaccinations. 3. You're scheduled for lab work in September, we will send you a reminder letter.

## 2013-06-17 NOTE — Addendum Note (Signed)
Addended by: Idamae Schuller on: 06/17/2013 10:03 AM   Modules accepted: Orders

## 2013-06-17 NOTE — Assessment & Plan Note (Signed)
Patient is interested in pursuing hepatitis C treatment here locally. History of stage II fibrosis on liver biopsy in 2003. Genotype 1A or 1B. He has active viremia as recently noted. Also still needs hepatitis A and B Vaccinations, he states he cannot afford them at this time. Last abdominal ultrasound in August or September of last year. To discuss with Dr. Gala Romney. Consider treatment of hepatitis C locally if we can get approved by insurance company. I will also see if we can get his hepatitis A and B vaccinations approved. Further recommendations to follow.

## 2013-06-17 NOTE — Assessment & Plan Note (Signed)
Due for early interval followup surveillance colonoscopy given poor prep last year. Today bowel prep planned. Ferritin 25 mg IV 30 minutes before the procedure to augment conscious sedation given history of prior alcohol use.  I have discussed the risks, alternatives, benefits with regards to but not limited to the risk of reaction to medication, bleeding, infection, perforation and the patient is agreeable to proceed. Written consent to be obtained.

## 2013-06-17 NOTE — Assessment & Plan Note (Signed)
Due for labs in September.

## 2013-06-19 ENCOUNTER — Encounter (HOSPITAL_COMMUNITY): Payer: Self-pay | Admitting: Pharmacy Technician

## 2013-07-08 ENCOUNTER — Ambulatory Visit (HOSPITAL_COMMUNITY)
Admission: RE | Admit: 2013-07-08 | Discharge: 2013-07-08 | Disposition: A | Discharge status: Home / Self Care | Payer: Medicare Other | Source: Ambulatory Visit | Attending: Internal Medicine | Admitting: Internal Medicine

## 2013-07-08 ENCOUNTER — Encounter (HOSPITAL_COMMUNITY)
Admission: RE | Disposition: A | Discharge status: Home / Self Care | Payer: Self-pay | Source: Ambulatory Visit | Attending: Internal Medicine

## 2013-07-08 ENCOUNTER — Encounter (HOSPITAL_COMMUNITY): Payer: Self-pay

## 2013-07-08 DIAGNOSIS — D126 Benign neoplasm of colon, unspecified: Secondary | ICD-10-CM | POA: Diagnosis not present

## 2013-07-08 DIAGNOSIS — K5730 Diverticulosis of large intestine without perforation or abscess without bleeding: Secondary | ICD-10-CM | POA: Insufficient documentation

## 2013-07-08 DIAGNOSIS — Z09 Encounter for follow-up examination after completed treatment for conditions other than malignant neoplasm: Secondary | ICD-10-CM | POA: Insufficient documentation

## 2013-07-08 DIAGNOSIS — B182 Chronic viral hepatitis C: Secondary | ICD-10-CM | POA: Insufficient documentation

## 2013-07-08 DIAGNOSIS — Z86010 Personal history of colonic polyps: Secondary | ICD-10-CM

## 2013-07-08 HISTORY — DX: Essential (primary) hypertension: I10

## 2013-07-08 HISTORY — PX: COLONOSCOPY: SHX5424

## 2013-07-08 SURGERY — COLONOSCOPY
Anesthesia: Moderate Sedation

## 2013-07-08 MED ORDER — PROMETHAZINE HCL 25 MG/ML IJ SOLN
25.0000 mg | Freq: Once | INTRAMUSCULAR | Status: AC
  Administered 2013-07-08: 25 mg via INTRAVENOUS

## 2013-07-08 MED ORDER — STERILE WATER FOR IRRIGATION IR SOLN
Status: DC | PRN
  Administered 2013-07-08: 08:00:00

## 2013-07-08 MED ORDER — MEPERIDINE HCL 100 MG/ML IJ SOLN
INTRAMUSCULAR | Status: AC
  Filled 2013-07-08: qty 2

## 2013-07-08 MED ORDER — SODIUM CHLORIDE 0.9 % IJ SOLN
INTRAMUSCULAR | Status: AC
  Filled 2013-07-08: qty 10

## 2013-07-08 MED ORDER — MIDAZOLAM HCL 5 MG/5ML IJ SOLN
INTRAMUSCULAR | Status: DC | PRN
  Administered 2013-07-08: 1 mg via INTRAVENOUS
  Administered 2013-07-08: 2 mg via INTRAVENOUS

## 2013-07-08 MED ORDER — ONDANSETRON HCL 4 MG/2ML IJ SOLN
INTRAMUSCULAR | Status: DC | PRN
  Administered 2013-07-08: 4 mg via INTRAVENOUS

## 2013-07-08 MED ORDER — MIDAZOLAM HCL 5 MG/5ML IJ SOLN
INTRAMUSCULAR | Status: AC
  Filled 2013-07-08: qty 10

## 2013-07-08 MED ORDER — ONDANSETRON HCL 4 MG/2ML IJ SOLN
INTRAMUSCULAR | Status: AC
  Filled 2013-07-08: qty 2

## 2013-07-08 MED ORDER — MEPERIDINE HCL 100 MG/ML IJ SOLN
INTRAMUSCULAR | Status: DC | PRN
  Administered 2013-07-08: 50 mg

## 2013-07-08 MED ORDER — PROMETHAZINE HCL 25 MG/ML IJ SOLN
INTRAMUSCULAR | Status: AC
  Filled 2013-07-08: qty 1

## 2013-07-08 MED ORDER — SODIUM CHLORIDE 0.9 % IV SOLN
INTRAVENOUS | Status: DC
  Administered 2013-07-08: 07:00:00 via INTRAVENOUS

## 2013-07-08 NOTE — Discharge Instructions (Addendum)
Colonoscopy Discharge Instructions  Read the instructions outlined below and refer to this sheet in the next few weeks. These discharge instructions provide you with general information on caring for yourself after you leave the hospital. Your doctor may also give you specific instructions. While your treatment has been planned according to the most current medical practices available, unavoidable complications occasionally occur. If you have any problems or questions after discharge, call Dr. Gala Romney at [phone]. ACTIVITY  You may resume your regular activity, but move at a slower pace for the next 24 hours.   Take frequent rest periods for the next 24 hours.   Walking will help get rid of the air and reduce the bloated feeling in your belly (abdomen).   No driving for 24 hours (because of the medicine (anesthesia) used during the test).    Do not sign any important legal documents or operate any machinery for 24 hours (because of the anesthesia used during the test).  NUTRITION  Drink plenty of fluids.   You may resume your normal diet as instructed by your doctor.   Begin with a light meal and progress to your normal diet. Heavy or fried foods are harder to digest and may make you feel sick to your stomach (nauseated).   Avoid alcoholic beverages for 24 hours or as instructed.  MEDICATIONS  You may resume your normal medications unless your doctor tells you otherwise.  WHAT YOU CAN EXPECT TODAY  Some feelings of bloating in the abdomen.   Passage of more gas than usual.   Spotting of blood in your stool or on the toilet paper.  IF YOU HAD POLYPS REMOVED DURING THE COLONOSCOPY:  No aspirin products for 7 days or as instructed.   No alcohol for 7 days or as instructed.   Eat a soft diet for the next 24 hours.  FINDING OUT THE RESULTS OF YOUR TEST Not all test results are available during your visit. If your test results are not back during the visit, make an appointment  with your caregiver to find out the results. Do not assume everything is normal if you have not heard from your caregiver or the medical facility. It is important for you to follow up on all of your test results.  SEEK IMMEDIATE MEDICAL ATTENTION IF:  You have more than a spotting of blood in your stool.   Your belly is swollen (abdominal distention).   You are nauseated or vomiting.   You have a temperature over 101.   You have abdominal pain or discomfort that is severe or gets worse throughout the day.      Colonic diverticulosis and one polyp found today. The polyp was removed. Your prep was very good today.  Further recommendations to follow pending review of pathology report  Diverticulosis Diverticulosis is a common condition that develops when small pouches (diverticula) form in the wall of the colon. The risk of diverticulosis increases with age. It happens more often in people who eat a low-fiber diet. Most individuals with diverticulosis have no symptoms. Those individuals with symptoms usually experience abdominal pain, constipation, or loose stools (diarrhea). HOME CARE INSTRUCTIONS   Increase the amount of fiber in your diet as directed by your caregiver or dietician. This may reduce symptoms of diverticulosis.  Your caregiver may recommend taking a dietary fiber supplement.  Drink at least 6 to 8 glasses of water each day to prevent constipation.  Try not to strain when you have a bowel movement.  Your caregiver may recommend avoiding nuts and seeds to prevent complications, although this is still an uncertain benefit.  Only take over-the-counter or prescription medicines for pain, discomfort, or fever as directed by your caregiver. FOODS WITH HIGH FIBER CONTENT INCLUDE:  Fruits. Apple, peach, pear, tangerine, raisins, prunes.  Vegetables. Brussels sprouts, asparagus, broccoli, cabbage, carrot, cauliflower, romaine lettuce, spinach, summer squash, tomato, winter  squash, zucchini.  Starchy Vegetables. Baked beans, kidney beans, lima beans, split peas, lentils, potatoes (with skin).  Grains. Whole wheat bread, brown rice, bran flake cereal, plain oatmeal, white rice, shredded wheat, bran muffins. SEEK IMMEDIATE MEDICAL CARE IF:   You develop increasing pain or severe bloating.  You have an oral temperature above 102 F (38.9 C), not controlled by medicine.  You develop vomiting or bowel movements that are bloody or black.   Colon Polyps Polyps are lumps of extra tissue growing inside the body. Polyps can grow in the large intestine (colon). Most colon polyps are noncancerous (benign). However, some colon polyps can become cancerous over time. Polyps that are larger than a pea may be harmful. To be safe, caregivers remove and test all polyps. CAUSES  Polyps form when mutations in the genes cause your cells to grow and divide even though no more tissue is needed. RISK FACTORS There are a number of risk factors that can increase your chances of getting colon polyps. They include:  Being older than 50 years.  Family history of colon polyps or colon cancer.  Long-term colon diseases, such as colitis or Crohn disease.  Being overweight.  Smoking.  Being inactive.  Drinking too much alcohol. SYMPTOMS  Most small polyps do not cause symptoms. If symptoms are present, they may include:  Blood in the stool. The stool may look dark red or black.  Constipation or diarrhea that lasts longer than 1 week. DIAGNOSIS People often do not know they have polyps until their caregiver finds them during a regular checkup. Your caregiver can use 4 tests to check for polyps:  Digital rectal exam. The caregiver wears gloves and feels inside the rectum. This test would find polyps only in the rectum.  Barium enema. The caregiver puts a liquid called barium into your rectum before taking X-rays of your colon. Barium makes your colon look white. Polyps are  dark, so they are easy to see in the X-ray pictures.  Sigmoidoscopy. A thin, flexible tube (sigmoidoscope) is placed into your rectum. The sigmoidoscope has a light and tiny camera in it. The caregiver uses the sigmoidoscope to look at the last third of your colon.  Colonoscopy. This test is like sigmoidoscopy, but the caregiver looks at the entire colon. This is the most common method for finding and removing polyps. TREATMENT  Any polyps will be removed during a sigmoidoscopy or colonoscopy. The polyps are then tested for cancer. PREVENTION  To help lower your risk of getting more colon polyps:  Eat plenty of fruits and vegetables. Avoid eating fatty foods.  Do not smoke.  Avoid drinking alcohol.  Exercise every day.  Lose weight if recommended by your caregiver.  Eat plenty of calcium and folate. Foods that are rich in calcium include milk, cheese, and broccoli. Foods that are rich in folate include chickpeas, kidney beans, and spinach. HOME CARE INSTRUCTIONS Keep all follow-up appointments as directed by your caregiver. You may need periodic exams to check for polyps. SEEK MEDICAL CARE IF: You notice bleeding during a bowel movement.

## 2013-07-08 NOTE — Interval H&P Note (Signed)
History and Physical Interval Note:  07/08/2013 7:35 AM  Kyle [name]  has presented today for surgery, with the diagnosis of H/O COLON POLYPS  The various methods of treatment have been discussed with the patient and family. After consideration of risks, benefits and other options for treatment, the patient has consented to  Procedure(s) with comments: COLONOSCOPY (N/A) - 7:30am as a surgical intervention .  The patient's history has been reviewed, patient examined, no change in status, stable for surgery.  I have reviewed the patient's chart and labs.  Questions were answered to the patient's satisfaction.    No change.   Colonoscopy per plan.The risks, benefits, limitations, alternatives and imponderables have been reviewed with the patient. Questions have been answered. All parties are agreeable.    Kyle [name]
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
## 2013-07-08 NOTE — Op Note (Signed)
[facility] [address] [place], [address]   COLONOSCOPY PROCEDURE REPORT  PATIENT: Kyle [name], Kyle [name]  MR#:         [id number] BIRTHDATE: [birth year] , 57  yrs. old GENDER: Male ENDOSCOPIST: R.  Garfield Cornea, MD FACP FACG REFERRED BY:  Matthias Hughs, M.D. PROCEDURE DATE:  07/08/2013 PROCEDURE:     Colonoscopy with snare polypectomy  INDICATIONS: History of high-grade adenomas-Poor prep colonoscopy one year ago  INFORMED CONSENT:  The risks, benefits, alternatives and imponderables including but not limited to bleeding, perforation as well as the possibility of a missed lesion have been reviewed.  The potential for biopsy, lesion removal, etc. have also been discussed.  Questions have been answered.  All parties agreeable. Please see the history and physical in the medical record for more information.  MEDICATIONS: Versed 3 mg IV and Demerol 50 mg IV in divided doses. Zofran 4 mg IV  DESCRIPTION OF PROCEDURE:  After a digital rectal exam was performed, the EC-[id number] ([id number])  colonoscope was advanced from the anus through the rectum and colon to the area of the cecum, ileocecal valve and appendiceal orifice.  The cecum was deeply intubated.  These structures were well-seen and photographed for the record.  From the level of the cecum and ileocecal valve, the scope was slowly and cautiously withdrawn.  The mucosal surfaces were carefully surveyed utilizing scope tip deflection to facilitate fold flattening as needed.  The scope was pulled down into the rectum where a thorough examination including retroflexion was performed.    FINDINGS:  Adequate preparation. Normal rectum. Pancolonic diverticulosis. (1) 6 mm polyp in between 2 folds in the mid transverse segment; otherwise, the remainder of the colonic mucosa appeared normal. THERAPEUTIC / DIAGNOSTIC MANEUVERS PERFORMED:  The above-mentioned polyp was hot snare removed and recovered for the  pathologist.   COMPLICATIONS: None  CECAL WITHDRAWAL TIME:  10 minutes  IMPRESSION:  Colonic diverticulosis. Colon polyp-removed as described above  RECOMMENDATIONS: Followup on pathology   _______________________________ eSigned:  R. Garfield Cornea, MD FACP [facility] 07/08/2013 8:05 AM   CC:    PATIENT NAME:  [name], [name] MR#: [id number]

## 2013-07-09 ENCOUNTER — Encounter (HOSPITAL_COMMUNITY): Payer: Self-pay | Admitting: Internal Medicine

## 2013-07-10 ENCOUNTER — Encounter: Payer: Self-pay | Admitting: Internal Medicine

## 2013-09-04 ENCOUNTER — Telehealth: Payer: Self-pay | Admitting: Gastroenterology

## 2013-09-04 NOTE — Telephone Encounter (Signed)
Let's touch base with patient and see if he wants to proceed with HCV treatment at this time. If so, we can start approval process for harvoni.  He will need HCV genotype,HIV ab,  ferritin, cbc, lft, met-7, PT/INR, TSH.

## 2013-09-09 DIAGNOSIS — B182 Chronic viral hepatitis C: Secondary | ICD-10-CM | POA: Diagnosis not present

## 2013-09-10 NOTE — Telephone Encounter (Signed)
I spoke with the pt. He definitely wants treatment for hep c. He went to the liver clinic yesterday and they drew multiple vials of blood. He asked them to send [place] that information. We have not received them yet. He also doesn't have any Rx coverage with his Medicare. I informed him that I would mail him the patient assistance paperwork for him to fill out. Paperwork in the mail to pt.  Manuela Schwartz, please be on the look out for his records and if they do not come this week please request them.

## 2013-09-16 NOTE — Telephone Encounter (Signed)
Sorry. I wasn't aware that he had been referred to the liver clinic. I will discuss with Roosevelt Locks NP.

## 2013-09-16 NOTE — Telephone Encounter (Signed)
We need recent labs and ov note from the liver clinic please.

## 2013-09-16 NOTE — Telephone Encounter (Signed)
I had a voice mail from Roosevelt Locks NP from the [facility]. She said she had seen pt in her office and he told her that we would be treating him for Hep C here. She said that it was fine with her if we wanted to start treatment here and if we changed our minds and he needed to be treated at the liver clinic to just let her know. She said if LSL needed to speak with her she can call- [phone]

## 2013-09-17 NOTE — Telephone Encounter (Signed)
We have received the OV note, but I am requesting the recent labs again.

## 2013-09-25 NOTE — Telephone Encounter (Signed)
I called [facility] in [place] and spoke with Lasteria at [phone] and she is faxing over labs.

## 2013-09-25 NOTE — Telephone Encounter (Signed)
I am still waiting on Fibrosure results and lab result. Please request and follow up on.

## 2013-09-25 NOTE — Telephone Encounter (Signed)
CM that is the number I called.

## 2013-09-25 NOTE — Telephone Encounter (Signed)
Kyle [name], please make sure we had the correct phone number.

## 2013-09-25 NOTE — Telephone Encounter (Signed)
I LMOM at the Liver Clinic that we are still waiting on lab results (done early August) and we also need the Fibrosure results. I asked for someone to return my call if there was any problem.

## 2013-09-25 NOTE — Telephone Encounter (Signed)
Labs has been received and placed alphabetically [facility]'s filing cart

## 2013-09-30 ENCOUNTER — Telehealth: Payer: Self-pay | Admitting: *Deleted

## 2013-09-30 NOTE — Telephone Encounter (Signed)
Labs are in [place] cart

## 2013-10-02 ENCOUNTER — Telehealth: Payer: Self-pay | Admitting: Gastroenterology

## 2013-10-02 NOTE — Telephone Encounter (Signed)
Tried to call pt- LMOM 

## 2013-10-02 NOTE — Telephone Encounter (Signed)
Pt is aware.  

## 2013-10-02 NOTE — Telephone Encounter (Signed)
Reviewed records from Liver clinic. Fiber test recently showed F4 score. Per Roosevelt Locks, NP at Hepatitis clinic, patient will need to have an abdominal ultrasound prior start in therapy to rule out possibility of hepatoma.  Last to be scanned. From 09/09/2013 HCV RNA 6,433,295 international units per mL. Genotype 1 WBC 8400, hemoglobin 15.3, hematocrit 42, platelets 363,000 INR 1.12 BUN 11, creatinine 0.9 Total bilirubin 1.1, alkaline phosphatase 110, AST 155, ALT 90, albumin 3.4 Ferritin 128 HIV 1/2 AG/AB nonreactive AFP 11.3 high  Please schedule abdominal u/s: Dx: hepatoma screening.  Please let patient know he should undergo phlebotomy based on current [facility] guidelines.   Please schedule him for removal of 500cc of blood at agency of his choice. Given his HCV he should not donate blood.   Repeat CBC, Iron/TIBC, ferritin in two months.

## 2013-10-02 NOTE — Telephone Encounter (Signed)
Phlebotomy orders have been faxed to [facility].

## 2013-10-02 NOTE — Telephone Encounter (Signed)
Kyle [name], before we schedule u/s, let me talk with Dr. Thornton Papas to see if we should also get elastography.

## 2013-10-03 DIAGNOSIS — B1920 Unspecified viral hepatitis C without hepatic coma: Secondary | ICD-10-CM | POA: Diagnosis not present

## 2013-10-03 DIAGNOSIS — I10 Essential (primary) hypertension: Secondary | ICD-10-CM | POA: Diagnosis not present

## 2013-10-03 DIAGNOSIS — Z0000 Encounter for general adult medical examination without abnormal findings: Secondary | ICD-10-CM | POA: Diagnosis not present

## 2013-10-03 DIAGNOSIS — K219 Gastro-esophageal reflux disease without esophagitis: Secondary | ICD-10-CM | POA: Diagnosis not present

## 2013-10-03 NOTE — Telephone Encounter (Signed)
Lab orders on file.  Kyle [name], when you decide which U/S this pt needs, please let Kyle [name] know. He is already aware that he needs to have an U/S.

## 2013-10-04 ENCOUNTER — Encounter (HOSPITAL_COMMUNITY)
Admission: RE | Admit: 2013-10-04 | Discharge: 2013-10-04 | Disposition: A | Discharge status: Home / Self Care | Payer: Medicare Other | Source: Ambulatory Visit | Attending: Gastroenterology | Admitting: Gastroenterology

## 2013-10-04 ENCOUNTER — Encounter (HOSPITAL_COMMUNITY): Payer: Self-pay

## 2013-10-04 NOTE — Progress Notes (Signed)
Kyle [name] presents today for phlebotomy per MD orders. Performed by Timoteo Expose, RN Phlebotomy procedure started at [phone] and ended at [phone]. 1 unit removed. Patient tolerated procedure well. IV needle removed intact.

## 2013-10-09 ENCOUNTER — Other Ambulatory Visit: Payer: Self-pay | Admitting: Gastroenterology

## 2013-10-09 ENCOUNTER — Encounter: Payer: Self-pay | Admitting: Gastroenterology

## 2013-10-09 DIAGNOSIS — C220 Liver cell carcinoma: Secondary | ICD-10-CM

## 2013-10-09 NOTE — Telephone Encounter (Signed)
[place] is schedule for Sept 10 at 8:45. Tried to call with no answer. Will mail letter

## 2013-10-09 NOTE — Telephone Encounter (Signed)
Kyle [name], please schedule, pt is already aware that he needs an U/S

## 2013-10-09 NOTE — Telephone Encounter (Signed)
Kyle [name], we can go ahead with abdominal u/s for hepatoma surveillance.   I spoke to Roosevelt Locks, NP at [facility]atitis Clinic, given his F4 score they usually go ahead and treat HCV based on cirrhosis guidelines. She would also advise hepatoma surveillance per cirrhosis guidelines.

## 2013-10-17 ENCOUNTER — Ambulatory Visit (HOSPITAL_COMMUNITY)
Admission: RE | Admit: 2013-10-17 | Discharge: 2013-10-17 | Disposition: A | Discharge status: Home / Self Care | Payer: Medicare Other | Source: Ambulatory Visit | Attending: Gastroenterology | Admitting: Gastroenterology

## 2013-10-17 DIAGNOSIS — C228 Malignant neoplasm of liver, primary, unspecified as to type: Secondary | ICD-10-CM | POA: Insufficient documentation

## 2013-10-17 DIAGNOSIS — K7689 Other specified diseases of liver: Secondary | ICD-10-CM | POA: Diagnosis not present

## 2013-10-17 DIAGNOSIS — C220 Liver cell carcinoma: Secondary | ICD-10-CM

## 2013-10-21 ENCOUNTER — Other Ambulatory Visit: Payer: Self-pay

## 2013-10-25 NOTE — Progress Notes (Signed)
Quick Note:  No evidence of hepatoma. Next abd u/s in 6 months for hepatoma surveillance.  I wrote RX for Harvoni and placed on Julie's desk. He was approved for free medication. Supply to be shipped to our office. ______

## 2013-10-30 NOTE — Progress Notes (Signed)
Quick Note:  Did we notify patient of results? We have not NIC for abd u/s in 6 months. Please arrange. ______

## 2013-10-30 NOTE — Progress Notes (Signed)
NIC'D [place] 6 MONTH

## 2013-10-31 ENCOUNTER — Telehealth: Payer: Self-pay | Admitting: Gastroenterology

## 2013-10-31 ENCOUNTER — Telehealth: Payer: Self-pay

## 2013-10-31 ENCOUNTER — Encounter: Payer: Self-pay | Admitting: Gastroenterology

## 2013-10-31 MED ORDER — FAMOTIDINE 20 MG PO TABS: 20.0000 mg | ORAL_TABLET | Freq: Two times a day (BID) | ORAL | Status: DC

## 2013-10-31 NOTE — Telephone Encounter (Signed)
Opened in error

## 2013-10-31 NOTE — Telephone Encounter (Signed)
Medication from Patient assistance for Harvoni came in today.   What instructions do you want me to give the pt.

## 2013-10-31 NOTE — Telephone Encounter (Signed)
Spoke with patient. He will pick up Harvoni tomorrow. Plans to start tomorrow, 11/01/13.  Advised to take same time every day. Advised to switch Zantac to Pepcid. Can take Pepcid 20mg  at the same time of Harvoni and then again in 12 hours if needed.  Do not start any new medications, prescription or otherwise without discussing with [place].  Please schedule patient OV with me in 4 weeks (exactly 4 weeks, may use urgent if needed). We will do his labs at that time.   Please give my letter with Harvoni when he picks up tomorrow.

## 2013-10-31 NOTE — Telephone Encounter (Signed)
I LMOAM at both listed telephone numbers. I need to speak with patient when he returns the call.

## 2013-10-31 NOTE — Telephone Encounter (Signed)
Medication, Leslie's letter and appointment card are in a bag at the front desk. There is a paper with it that pt will need to sign when he picks it up.   Erline Levine and Manuela Schwartz, please make sure he signs the paper with his medication. Then you can leave it on my desk and I will put it with his other paperwork. Thanks.

## 2013-11-01 NOTE — Telephone Encounter (Signed)
Magda Paganini and Almyra Free, Marzetta Board said this pt did not come in today.

## 2013-11-01 NOTE — Progress Notes (Signed)
ULTRASOUND NIC'D °

## 2013-11-04 NOTE — Telephone Encounter (Signed)
Pt picked up medication this morning.

## 2013-11-13 DIAGNOSIS — Z23 Encounter for immunization: Secondary | ICD-10-CM | POA: Diagnosis not present

## 2013-11-25 LAB — CBC WITH DIFFERENTIAL/PLATELET
BASOS PCT: 1 % (ref 0–1)
Basophils Absolute: 0.1 10*3/uL (ref 0.0–0.1)
Eosinophils Absolute: 0.3 10*3/uL (ref 0.0–0.7)
Eosinophils Relative: 3 % (ref 0–5)
HCT: 40.9 % (ref 39.0–52.0)
Hemoglobin: 13.9 g/dL (ref 13.0–17.0)
LYMPHS ABS: 4.2 10*3/uL — AB (ref 0.7–4.0)
Lymphocytes Relative: 41 % (ref 12–46)
MCH: 30.3 pg (ref 26.0–34.0)
MCHC: 34 g/dL (ref 30.0–36.0)
MCV: 89.1 fL (ref 78.0–100.0)
Monocytes Absolute: 1.4 10*3/uL — ABNORMAL HIGH (ref 0.1–1.0)
Monocytes Relative: 14 % — ABNORMAL HIGH (ref 3–12)
NEUTROS PCT: 41 % — AB (ref 43–77)
Neutro Abs: 4.2 10*3/uL (ref 1.7–7.7)
PLATELETS: 464 10*3/uL — AB (ref 150–400)
RBC: 4.59 MIL/uL (ref 4.22–5.81)
RDW: 14.5 % (ref 11.5–15.5)
WBC: 10.3 10*3/uL (ref 4.0–10.5)

## 2013-11-26 LAB — IRON AND TIBC
%SAT: 22 % (ref 20–55)
Iron: 62 ug/dL (ref 42–165)
TIBC: 278 ug/dL (ref 215–435)
UIBC: 216 ug/dL (ref 125–400)

## 2013-11-28 ENCOUNTER — Telehealth: Payer: Self-pay

## 2013-11-28 ENCOUNTER — Ambulatory Visit (INDEPENDENT_AMBULATORY_CARE_PROVIDER_SITE_OTHER): Payer: Medicare Other | Admitting: Gastroenterology

## 2013-11-28 ENCOUNTER — Encounter: Payer: Self-pay | Admitting: Gastroenterology

## 2013-11-28 ENCOUNTER — Other Ambulatory Visit: Payer: Self-pay

## 2013-11-28 ENCOUNTER — Other Ambulatory Visit: Payer: Self-pay | Admitting: Gastroenterology

## 2013-11-28 VITALS — BP 137/84 | HR 68 | Temp 97.5°F | Ht 70.0 in | Wt 224.8 lb

## 2013-11-28 DIAGNOSIS — B182 Chronic viral hepatitis C: Secondary | ICD-10-CM

## 2013-11-28 NOTE — Addendum Note (Signed)
Addended by: Mahala Menghini on: 11/28/2013 04:20 PM   Modules accepted: Orders

## 2013-11-28 NOTE — Assessment & Plan Note (Signed)
In his 3rd week of Harvoni. Will plan to check labs along with HCV RNA quantitative next week. We will look into where his shipment is for next month. Reiterated need to inform us of any medication changes.

## 2013-11-28 NOTE — Progress Notes (Signed)
cc'ed to pcp °

## 2013-11-28 NOTE — Telephone Encounter (Signed)
Late entry- Harvoni patient assistance called yesterday and said they were shipping out pts next supply of Harvoni and we should receive it on Friday. They have our PO box address. I called pt today and LMOM, advising him that I would be going to the post office on Friday and hopefully it will be here be before we close. If not I will definitely have it by Monday morning. Asked him to call if he has any questions. LSL wants pt to have a CMP,ferritin and HCV RNA quant. All lab orders are done and will give them to the pt when he picks up the [place].

## 2013-11-28 NOTE — Progress Notes (Signed)
Quick Note:  Patient seen in office. See office note. ______

## 2013-11-28 NOTE — Patient Instructions (Signed)
1. I will have Kyle [name] check into where the next shipment of Harvoni is and we will call you. You will be due for labs next week, we will plan to do when you come back to pick up Kyle [name].  2. You may take Pepcid twice a day for GERD. Take with Harvoni and then 12 hours later. Call if you have persistent GERD symptoms.

## 2013-11-28 NOTE — Telephone Encounter (Signed)
thanks

## 2013-11-28 NOTE — Progress Notes (Signed)
Primary Care Physician: Deloria Lair, MD  Primary Gastroenterologist:  Garfield Cornea, MD   Chief Complaint  Patient presents with  . Follow-up    HPI: Kyle [name] is a 58 y.o. male here for followup. He has a history of chronic hepatitis C, genotype 1A previously intolerant to interferon. F4 score with recent reevaluation. He will require 24 weeks of therapy.  He started Harvoni on 11/04/2013. Denies any side effects. Has been very compliant with his medication. We switched him from Zantac to Pepcid which he has been taking just in the mornings with the Harvoni. He thinks he needs an additional dose in the evening. We discussed that he would need to take it 12 hours apart from the [place]. He questions when his next shipment will arrive. He has 4 pills left. Recently had labs regarding hemochromatosis. His iron saturations was 24%. Ferritin was not done. Hemoglobin normal. Bowel movements are regular. No blood in the stool or melena. Denies any abdominal pain. No fever.     Current Outpatient Prescriptions  Medication Sig Dispense Refill  . atenolol (TENORMIN) 50 MG tablet Take 50 mg by mouth daily.      . famotidine (PEPCID) 20 MG tablet Take 1 tablet (20 mg total) by mouth 2 (two) times daily.      . Ibuprofen (EQ IBUPROFEN) 200 MG CAPS Take 1 capsule (200 mg total) by mouth as needed.    0  . Ledipasvir-Sofosbuvir (HARVONI PO) Take 1 tablet by mouth daily.       Marland Kitchen lisinopril-hydrochlorothiazide (PRINZIDE,ZESTORETIC) 20-25 MG per tablet Take 1 tablet by mouth daily.      . Multiple Vitamin (MULTIVITAMIN) capsule Take 1 capsule by mouth daily.       No current facility-administered medications for this visit.    Allergies as of 11/28/2013  . (No Known Allergies)    ROS:  General: Negative for anorexia, weight loss, fever, chills, fatigue, weakness. ENT: Negative for hoarseness, difficulty swallowing , nasal congestion. CV: Negative for chest pain, angina,  palpitations, dyspnea on exertion, peripheral edema.  Respiratory: Negative for dyspnea at rest, dyspnea on exertion, cough, sputum, wheezing.  GI: See history of present illness. GU:  Negative for dysuria, hematuria, urinary incontinence, urinary frequency, nocturnal urination.  Endo: Negative for unusual weight change.    Physical Examination:   BP 137/84  Pulse 68  Temp(Src) 97.5 F (36.4 C) (Oral)  Ht 5\' 10"  (1.778 m)  Wt 224 lb 12.8 oz (101.969 kg)  BMI 32.26 kg/m2  General: Well-nourished, well-developed in no acute distress.  Eyes: No icterus. Mouth: Oropharyngeal mucosa moist and pink , no lesions erythema or exudate. Lungs: Clear to auscultation bilaterally.  Heart: Regular rate and rhythm, no murmurs rubs or gallops.  Abdomen: Bowel sounds are normal, nontender, nondistended, no hepatosplenomegaly or masses, no abdominal bruits or hernia , no rebound or guarding.   Extremities: No lower extremity edema. No clubbing or deformities. Neuro: Alert and oriented x 4   Skin: Warm and dry, no jaundice.   Psych: Alert and cooperative, normal mood and affect.  Labs:  Lab Results  Component Value Date   WBC 10.3 11/25/2013   HGB 13.9 11/25/2013   HCT 40.9 11/25/2013   MCV 89.1 11/25/2013   PLT 464* 11/25/2013   Lab Results  Component Value Date   IRON 62 11/25/2013   TIBC 278 11/25/2013   FERRITIN 60 04/23/2013    Imaging Studies: No results found.

## 2013-11-28 NOTE — Assessment & Plan Note (Signed)
Check ferritin with next HCV labs.

## 2013-12-02 DIAGNOSIS — B182 Chronic viral hepatitis C: Secondary | ICD-10-CM | POA: Diagnosis not present

## 2013-12-02 NOTE — Telephone Encounter (Signed)
noted 

## 2013-12-02 NOTE — Telephone Encounter (Signed)
Harvoni has arrived. I have contacted pt and informed him that it is here. He will come over to day and pick it up. He is aware that he needs to have blood work done and will do it today. His lab orders are with his medication.

## 2013-12-03 LAB — COMPREHENSIVE METABOLIC PANEL
ALK PHOS: 77 U/L (ref 39–117)
ALT: 19 U/L (ref 0–53)
AST: 29 U/L (ref 0–37)
Albumin: 3.5 g/dL (ref 3.5–5.2)
BILIRUBIN TOTAL: 0.7 mg/dL (ref 0.2–1.2)
BUN: 18 mg/dL (ref 6–23)
CO2: 28 meq/L (ref 19–32)
CREATININE: 1.02 mg/dL (ref 0.50–1.35)
Calcium: 9.2 mg/dL (ref 8.4–10.5)
Chloride: 100 mEq/L (ref 96–112)
GLUCOSE: 84 mg/dL (ref 70–99)
Potassium: 4.1 mEq/L (ref 3.5–5.3)
Sodium: 137 mEq/L (ref 135–145)
Total Protein: 7.5 g/dL (ref 6.0–8.3)

## 2013-12-03 LAB — HEPATITIS C RNA QUANTITATIVE: HCV Quantitative: NOT DETECTED IU/mL (ref <15)

## 2013-12-03 LAB — FERRITIN: FERRITIN: 28 ng/mL (ref 22–322)

## 2013-12-04 ENCOUNTER — Other Ambulatory Visit: Payer: Self-pay | Admitting: Gastroenterology

## 2013-12-04 DIAGNOSIS — B182 Chronic viral hepatitis C: Secondary | ICD-10-CM

## 2013-12-04 NOTE — Progress Notes (Signed)
Quick Note:  Please let patient know his labs are great! His LFTs are NORMAL! His HCV RNA is NEGATIVE! His ferritin is 28, no need for phlebotomy at this time.   Continue Harvoni once daily. We need to make sure his shipments keep on time so he doesn't miss any doses. He needs repeat CBC, ferritin, CMET in 6 weeks.   ______

## 2013-12-24 ENCOUNTER — Other Ambulatory Visit: Payer: Self-pay | Admitting: *Deleted

## 2013-12-24 ENCOUNTER — Encounter: Payer: Self-pay | Admitting: *Deleted

## 2013-12-24 ENCOUNTER — Telehealth: Payer: Self-pay

## 2013-12-24 DIAGNOSIS — B182 Chronic viral hepatitis C: Secondary | ICD-10-CM

## 2013-12-24 NOTE — Telephone Encounter (Signed)
Noted. Thanks.

## 2013-12-24 NOTE — Telephone Encounter (Signed)
Pt called today to see when he needed labs and to let me know that he only had 5 days left of his harvoni. Pt is aware that his labs are due in December. I called patient assistance and they are going to send it out and we should get it on Thursday. She told me that they tell the patients to call them to reorder 7-10 days before they run out of medication. I will call the pt when we receive it.

## 2013-12-26 NOTE — Telephone Encounter (Signed)
pts medication has arrived. Left it at the front desk. Tried to call pt- LM on home and cell number that medication was here and he could pick it up either today before 5 or tomorrow before 12.

## 2014-01-15 DIAGNOSIS — B182 Chronic viral hepatitis C: Secondary | ICD-10-CM | POA: Diagnosis not present

## 2014-01-16 LAB — COMPREHENSIVE METABOLIC PANEL
ALBUMIN: 3.5 g/dL (ref 3.5–5.2)
ALK PHOS: 76 U/L (ref 39–117)
ALT: 22 U/L (ref 0–53)
AST: 31 U/L (ref 0–37)
BUN: 16 mg/dL (ref 6–23)
CO2: 29 mEq/L (ref 19–32)
Calcium: 9.7 mg/dL (ref 8.4–10.5)
Chloride: 95 mEq/L — ABNORMAL LOW (ref 96–112)
Creat: 1.29 mg/dL (ref 0.50–1.35)
Glucose, Bld: 133 mg/dL — ABNORMAL HIGH (ref 70–99)
POTASSIUM: 4.1 meq/L (ref 3.5–5.3)
SODIUM: 137 meq/L (ref 135–145)
Total Bilirubin: 1.3 mg/dL — ABNORMAL HIGH (ref 0.2–1.2)
Total Protein: 7.7 g/dL (ref 6.0–8.3)

## 2014-01-16 LAB — CBC WITH DIFFERENTIAL/PLATELET
Basophils Absolute: 0.2 10*3/uL — ABNORMAL HIGH (ref 0.0–0.1)
Basophils Relative: 2 % — ABNORMAL HIGH (ref 0–1)
Eosinophils Absolute: 0.1 10*3/uL (ref 0.0–0.7)
Eosinophils Relative: 1 % (ref 0–5)
HCT: 44.3 % (ref 39.0–52.0)
Hemoglobin: 15.1 g/dL (ref 13.0–17.0)
LYMPHS PCT: 37 % (ref 12–46)
Lymphs Abs: 3.7 10*3/uL (ref 0.7–4.0)
MCH: 30 pg (ref 26.0–34.0)
MCHC: 34.1 g/dL (ref 30.0–36.0)
MCV: 87.9 fL (ref 78.0–100.0)
MPV: 9.8 fL (ref 9.4–12.4)
Monocytes Absolute: 1.4 10*3/uL — ABNORMAL HIGH (ref 0.1–1.0)
Monocytes Relative: 14 % — ABNORMAL HIGH (ref 3–12)
NEUTROS ABS: 4.6 10*3/uL (ref 1.7–7.7)
NEUTROS PCT: 46 % (ref 43–77)
PLATELETS: 345 10*3/uL (ref 150–400)
RBC: 5.04 MIL/uL (ref 4.22–5.81)
RDW: 15.8 % — ABNORMAL HIGH (ref 11.5–15.5)
WBC: 10.1 10*3/uL (ref 4.0–10.5)

## 2014-01-16 LAB — FERRITIN: FERRITIN: 28 ng/mL (ref 22–322)

## 2014-01-16 NOTE — Progress Notes (Signed)
Quick Note:  Labs are stable.  Let's get him schedule for a routine OV with me, nonurgent. He will need to have CBC, CMET, HCVRNA quantitative, ferritin prior to that OV. ______

## 2014-01-20 ENCOUNTER — Encounter: Payer: Self-pay | Admitting: Internal Medicine

## 2014-01-20 ENCOUNTER — Other Ambulatory Visit: Payer: Self-pay | Admitting: Gastroenterology

## 2014-01-20 DIAGNOSIS — B182 Chronic viral hepatitis C: Secondary | ICD-10-CM

## 2014-01-20 NOTE — Progress Notes (Signed)
APPT MADE AND LETTER SENT  °

## 2014-01-27 ENCOUNTER — Telehealth: Payer: Self-pay

## 2014-01-27 NOTE — Telephone Encounter (Signed)
pts harvoni came in today. I called to inform pt, he thought he was done with taking harvoni. I told him I would check into it and call him back. After some research, per [facility] ov note, pt needs harvoni for 24 weeks. I tried to call pt back to let him know, NA, I left a message on his voicemail and informed him that he needed it for 24 weeks and that his harvoni was at the front desk and if he has any questions, he can call us.

## 2014-01-28 NOTE — Telephone Encounter (Signed)
Pt picked up harvoni yesterday.

## 2014-01-28 NOTE — Telephone Encounter (Signed)
noted 

## 2014-01-28 NOTE — Telephone Encounter (Signed)
He needs Harvoni 24 weeks because of F4 score and previous failure. Let's make sure patient gets his meds and continues.

## 2014-02-04 ENCOUNTER — Other Ambulatory Visit: Payer: Self-pay

## 2014-02-04 DIAGNOSIS — B182 Chronic viral hepatitis C: Secondary | ICD-10-CM

## 2014-02-24 DIAGNOSIS — B182 Chronic viral hepatitis C: Secondary | ICD-10-CM | POA: Diagnosis not present

## 2014-02-24 LAB — CBC WITH DIFFERENTIAL/PLATELET
BASOS ABS: 0.1 10*3/uL (ref 0.0–0.1)
Basophils Relative: 1 % (ref 0–1)
EOS ABS: 0.2 10*3/uL (ref 0.0–0.7)
EOS PCT: 2 % (ref 0–5)
HCT: 44 % (ref 39.0–52.0)
Hemoglobin: 15.1 g/dL (ref 13.0–17.0)
Lymphocytes Relative: 32 % (ref 12–46)
Lymphs Abs: 3.6 10*3/uL (ref 0.7–4.0)
MCH: 30.7 pg (ref 26.0–34.0)
MCHC: 34.3 g/dL (ref 30.0–36.0)
MCV: 89.4 fL (ref 78.0–100.0)
MONOS PCT: 16 % — AB (ref 3–12)
MPV: 9.6 fL (ref 8.6–12.4)
Monocytes Absolute: 1.8 10*3/uL — ABNORMAL HIGH (ref 0.1–1.0)
NEUTROS ABS: 5.4 10*3/uL (ref 1.7–7.7)
NEUTROS PCT: 49 % (ref 43–77)
Platelets: 395 10*3/uL (ref 150–400)
RBC: 4.92 MIL/uL (ref 4.22–5.81)
RDW: 16.1 % — AB (ref 11.5–15.5)
WBC: 11.1 10*3/uL — ABNORMAL HIGH (ref 4.0–10.5)

## 2014-02-25 LAB — COMPREHENSIVE METABOLIC PANEL
ALT: 22 U/L (ref 0–53)
AST: 30 U/L (ref 0–37)
Albumin: 3.6 g/dL (ref 3.5–5.2)
Alkaline Phosphatase: 91 U/L (ref 39–117)
BILIRUBIN TOTAL: 0.9 mg/dL (ref 0.2–1.2)
BUN: 15 mg/dL (ref 6–23)
CALCIUM: 9.6 mg/dL (ref 8.4–10.5)
CO2: 23 mEq/L (ref 19–32)
Chloride: 103 mEq/L (ref 96–112)
Creat: 1.26 mg/dL (ref 0.50–1.35)
GLUCOSE: 81 mg/dL (ref 70–99)
Potassium: 5.2 mEq/L (ref 3.5–5.3)
Sodium: 140 mEq/L (ref 135–145)
TOTAL PROTEIN: 7.5 g/dL (ref 6.0–8.3)

## 2014-02-25 LAB — HEPATITIS C RNA QUANTITATIVE: HCV Quantitative: NOT DETECTED IU/mL (ref <15)

## 2014-02-25 LAB — FERRITIN: Ferritin: 22 ng/mL (ref 22–322)

## 2014-03-05 NOTE — Progress Notes (Signed)
Quick Note:  Labs are stable. HCV RNA remains negative. Keep OV as scheduled this month. ______

## 2014-03-06 ENCOUNTER — Ambulatory Visit (INDEPENDENT_AMBULATORY_CARE_PROVIDER_SITE_OTHER): Payer: Medicare Other | Admitting: Gastroenterology

## 2014-03-06 ENCOUNTER — Encounter: Payer: Self-pay | Admitting: Gastroenterology

## 2014-03-06 VITALS — BP 122/79 | HR 65 | Temp 98.4°F | Ht 71.0 in | Wt 219.0 lb

## 2014-03-06 DIAGNOSIS — B182 Chronic viral hepatitis C: Secondary | ICD-10-CM

## 2014-03-06 NOTE — Assessment & Plan Note (Addendum)
Patient doing extremely well little more than halfway through hepatitis C treatment. Completion date of March 13. Continue current regimen. We will recheck his labs after he is completed therapy including HCV RNA level, LFTs, CBC and ferritin given history of hemochromatosis as well. Plan to see him back in the office in 6 months. He will be due for abdominal ultrasound for hepatoma screening in March.  Consider EGD for esophageal varices screening given F4 score. To discuss at next OV.

## 2014-03-06 NOTE — Progress Notes (Signed)
      Primary Care Physician: Deloria Lair, MD  Primary Gastroenterologist:  Garfield Cornea, MD   Chief Complaint  Patient presents with  . Hepatitis C    HPI: Kyle [name] is a 59 y.o. male here for follow-up. He has a history of chronic hepatitis C, genotype 1A previously intolerant to interferon. F for score with recent evaluation. Started Harvoni on 11/04/2013. Will require 24 weeks of therapy with completion date of 04/20/2014. Last labs one week ago. LFTs, ferritin, HCV RNA undetectable. States he feels the best he's ever felt. Energy level has improved. Appetite is good. Denies abdominal pain. Mild heartburn controlled with Pepcid. Bowel movements are regular. No blood in the stool or melena. No fever. Compliant with medication.   Current Outpatient Prescriptions  Medication Sig Dispense Refill  . atenolol (TENORMIN) 50 MG tablet Take 50 mg by mouth daily.    . famotidine (PEPCID) 20 MG tablet Take 1 tablet (20 mg total) by mouth 2 (two) times daily.    . Ibuprofen (EQ IBUPROFEN) 200 MG CAPS Take 1 capsule (200 mg total) by mouth as needed.  0  . Ledipasvir-Sofosbuvir (HARVONI PO) Take 1 tablet by mouth daily.     Marland Kitchen lisinopril-hydrochlorothiazide (PRINZIDE,ZESTORETIC) 20-25 MG per tablet Take 1 tablet by mouth daily.    . Multiple Vitamin (MULTIVITAMIN) capsule Take 1 capsule by mouth daily.     No current facility-administered medications for this visit.    Allergies as of 03/06/2014  . (No Known Allergies)    ROS:  General: Negative for anorexia, weight loss, fever, chills, fatigue, weakness. ENT: Negative for hoarseness, difficulty swallowing , nasal congestion. CV: Negative for chest pain, angina, palpitations, dyspnea on exertion, peripheral edema.  Respiratory: Negative for dyspnea at rest, dyspnea on exertion, cough, sputum, wheezing.  GI: See history of present illness. GU:  Negative for dysuria, hematuria, urinary incontinence, urinary frequency, nocturnal  urination.  Endo: Negative for unusual weight change.    Physical Examination:   BP 122/79 mmHg  Pulse 65  Temp(Src) 98.4 F (36.9 C) (Oral)  Ht 5\' 11"  (1.803 m)  Wt 219 lb (99.338 kg)  BMI 30.56 kg/m2  General: Well-nourished, well-developed in no acute distress.  Eyes: No icterus. Mouth: Oropharyngeal mucosa moist and pink , no lesions erythema or exudate. Lungs: Clear to auscultation bilaterally.  Heart: Regular rate and rhythm, no murmurs rubs or gallops.  Abdomen: Bowel sounds are normal, nontender, nondistended, no hepatosplenomegaly or masses, no abdominal bruits or hernia , no rebound or guarding.   Extremities: No lower extremity edema. No clubbing or deformities. Neuro: Alert and oriented x 4   Skin: Warm and dry, no jaundice.   Psych: Alert and cooperative, normal mood and affect.  Labs:  Lab Results  Component Value Date   CREATININE 1.26 02/24/2014   BUN 15 02/24/2014   NA 140 02/24/2014   K 5.2 02/24/2014   CL 103 02/24/2014   CO2 23 02/24/2014   Lab Results  Component Value Date   ALT 22 02/24/2014   AST 30 02/24/2014   ALKPHOS 91 02/24/2014   BILITOT 0.9 02/24/2014   Lab Results  Component Value Date   WBC 11.1* 02/24/2014   HGB 15.1 02/24/2014   HCT 44.0 02/24/2014   MCV 89.4 02/24/2014   PLT 395 02/24/2014   HCV RNA undetectable on 02/24/2014  Imaging Studies: No results found.

## 2014-03-06 NOTE — Patient Instructions (Signed)
1. Continue Harvoni and Pepcid as before. 2. You will be due for your next labs once you complete therapy towards the end of March. We will send you a reminder letter.  3. Return to office in 6 months for follow up.  4. Call with any questions or concerns.

## 2014-03-07 ENCOUNTER — Other Ambulatory Visit: Payer: Self-pay | Admitting: Gastroenterology

## 2014-03-07 DIAGNOSIS — B182 Chronic viral hepatitis C: Secondary | ICD-10-CM

## 2014-03-07 NOTE — Progress Notes (Signed)
cc'ed to pcp °

## 2014-03-11 ENCOUNTER — Telehealth: Payer: Self-pay | Admitting: Internal Medicine

## 2014-03-11 NOTE — Telephone Encounter (Signed)
noted 

## 2014-03-11 NOTE — Telephone Encounter (Signed)
HARVONI will be delivered to [place] on Friday 03/14/2014

## 2014-03-19 NOTE — Telephone Encounter (Addendum)
Harvoni has arrived and I have called the pt- LMOM and asked him to come and pick it up. It is at the front desk for him.

## 2014-03-31 ENCOUNTER — Other Ambulatory Visit: Payer: Self-pay

## 2014-03-31 DIAGNOSIS — B182 Chronic viral hepatitis C: Secondary | ICD-10-CM

## 2014-04-10 ENCOUNTER — Telehealth: Payer: Self-pay | Admitting: Internal Medicine

## 2014-04-10 NOTE — Telephone Encounter (Signed)
On recall for ultrasound  

## 2014-04-10 NOTE — Telephone Encounter (Signed)
Mailed reminder letter

## 2014-04-14 ENCOUNTER — Other Ambulatory Visit: Payer: Self-pay

## 2014-04-14 DIAGNOSIS — B182 Chronic viral hepatitis C: Secondary | ICD-10-CM

## 2014-04-18 ENCOUNTER — Ambulatory Visit (HOSPITAL_COMMUNITY): Payer: Medicare Other

## 2014-04-22 ENCOUNTER — Ambulatory Visit (HOSPITAL_COMMUNITY)
Admission: RE | Admit: 2014-04-22 | Discharge: 2014-04-22 | Disposition: A | Discharge status: Home / Self Care | Payer: Medicare Other | Source: Ambulatory Visit | Attending: Gastroenterology | Admitting: Gastroenterology

## 2014-04-22 DIAGNOSIS — B182 Chronic viral hepatitis C: Secondary | ICD-10-CM | POA: Diagnosis not present

## 2014-04-22 DIAGNOSIS — Z9081 Acquired absence of spleen: Secondary | ICD-10-CM | POA: Diagnosis not present

## 2014-04-27 NOTE — Progress Notes (Signed)
Quick Note:  U/S stable. No hepatoma.  1. NIC for abd u/s in six months.  2. Remind patient to have his labs done. ______

## 2014-05-05 DIAGNOSIS — B182 Chronic viral hepatitis C: Secondary | ICD-10-CM | POA: Diagnosis not present

## 2014-05-05 LAB — CBC WITH DIFFERENTIAL/PLATELET
Basophils Absolute: 0.1 10*3/uL (ref 0.0–0.1)
Basophils Relative: 1 % (ref 0–1)
EOS PCT: 3 % (ref 0–5)
Eosinophils Absolute: 0.2 10*3/uL (ref 0.0–0.7)
HCT: 45.1 % (ref 39.0–52.0)
HEMOGLOBIN: 15.4 g/dL (ref 13.0–17.0)
LYMPHS ABS: 3.3 10*3/uL (ref 0.7–4.0)
LYMPHS PCT: 41 % (ref 12–46)
MCH: 31.8 pg (ref 26.0–34.0)
MCHC: 34.1 g/dL (ref 30.0–36.0)
MCV: 93 fL (ref 78.0–100.0)
MONO ABS: 1.3 10*3/uL — AB (ref 0.1–1.0)
MPV: 9.4 fL (ref 8.6–12.4)
Monocytes Relative: 16 % — ABNORMAL HIGH (ref 3–12)
NEUTROS ABS: 3.1 10*3/uL (ref 1.7–7.7)
Neutrophils Relative %: 39 % — ABNORMAL LOW (ref 43–77)
Platelets: 398 10*3/uL (ref 150–400)
RBC: 4.85 MIL/uL (ref 4.22–5.81)
RDW: 14.5 % (ref 11.5–15.5)
WBC: 8 10*3/uL (ref 4.0–10.5)

## 2014-05-05 NOTE — Progress Notes (Signed)
ON RECALL  °

## 2014-05-06 LAB — HEPATITIS C RNA QUANTITATIVE: HCV Quantitative: NOT DETECTED IU/mL (ref <15)

## 2014-05-06 LAB — HEPATIC FUNCTION PANEL
ALBUMIN: 3.3 g/dL — AB (ref 3.5–5.2)
ALT: 19 U/L (ref 0–53)
AST: 23 U/L (ref 0–37)
Alkaline Phosphatase: 75 U/L (ref 39–117)
BILIRUBIN INDIRECT: 0.6 mg/dL (ref 0.2–1.2)
Bilirubin, Direct: 0.2 mg/dL (ref 0.0–0.3)
Total Bilirubin: 0.8 mg/dL (ref 0.2–1.2)
Total Protein: 6.9 g/dL (ref 6.0–8.3)

## 2014-05-06 LAB — FERRITIN: Ferritin: 21 ng/mL — ABNORMAL LOW (ref 22–322)

## 2014-05-13 NOTE — Progress Notes (Signed)
Quick Note:  Labs are looking GREAT!!! Ferritin is ok. No need for phlebotomy. LFTs normal. HCV RNA NEGATIVE.  Recheck ferritin, CBC in 12 weeks.  We will recheck HCV RNA quantitative once more in 11/2014 (please NIC).  ______

## 2014-05-20 ENCOUNTER — Other Ambulatory Visit: Payer: Self-pay | Admitting: Gastroenterology

## 2014-05-20 DIAGNOSIS — B182 Chronic viral hepatitis C: Secondary | ICD-10-CM

## 2014-06-30 ENCOUNTER — Other Ambulatory Visit: Payer: Self-pay

## 2014-07-23 ENCOUNTER — Encounter: Payer: Self-pay | Admitting: Internal Medicine

## 2014-08-05 LAB — CBC WITH DIFFERENTIAL/PLATELET
BASOS PCT: 2 % — AB (ref 0–1)
Basophils Absolute: 0.2 10*3/uL — ABNORMAL HIGH (ref 0.0–0.1)
EOS ABS: 0.1 10*3/uL (ref 0.0–0.7)
Eosinophils Relative: 1 % (ref 0–5)
HCT: 46.5 % (ref 39.0–52.0)
Hemoglobin: 16.3 g/dL (ref 13.0–17.0)
LYMPHS PCT: 33 % (ref 12–46)
Lymphs Abs: 2.5 10*3/uL (ref 0.7–4.0)
MCH: 32.8 pg (ref 26.0–34.0)
MCHC: 35.1 g/dL (ref 30.0–36.0)
MCV: 93.6 fL (ref 78.0–100.0)
MPV: 9.9 fL (ref 8.6–12.4)
Monocytes Absolute: 1 10*3/uL (ref 0.1–1.0)
Monocytes Relative: 13 % — ABNORMAL HIGH (ref 3–12)
NEUTROS PCT: 51 % (ref 43–77)
Neutro Abs: 3.9 10*3/uL (ref 1.7–7.7)
Platelets: 430 10*3/uL — ABNORMAL HIGH (ref 150–400)
RBC: 4.97 MIL/uL (ref 4.22–5.81)
RDW: 14.7 % (ref 11.5–15.5)
WBC: 7.7 10*3/uL (ref 4.0–10.5)

## 2014-08-06 LAB — FERRITIN: Ferritin: 24 ng/mL (ref 22–322)

## 2014-08-06 NOTE — Progress Notes (Signed)
Quick Note:  Labs continue to look good. Normal ferritin.  Recheck labs in 11/2014. Some are previously order. Let's make sure we don't have patient go back to lab several times. This order overrides previous orders. Thanks! In 11/2014: need HCV RNA quantitative, CBC, iron/TIBC, ferritin,CMET, PT/INR. Dx: cirrhosis, hemochromatosis.  Keep OV next month. ______

## 2014-08-26 ENCOUNTER — Encounter: Payer: Self-pay | Admitting: Gastroenterology

## 2014-08-26 ENCOUNTER — Ambulatory Visit (INDEPENDENT_AMBULATORY_CARE_PROVIDER_SITE_OTHER): Payer: Medicare Other | Admitting: Gastroenterology

## 2014-08-26 ENCOUNTER — Other Ambulatory Visit: Payer: Self-pay

## 2014-08-26 DIAGNOSIS — K219 Gastro-esophageal reflux disease without esophagitis: Secondary | ICD-10-CM | POA: Insufficient documentation

## 2014-08-26 DIAGNOSIS — B182 Chronic viral hepatitis C: Secondary | ICD-10-CM

## 2014-08-26 DIAGNOSIS — K7460 Unspecified cirrhosis of liver: Secondary | ICD-10-CM | POA: Insufficient documentation

## 2014-08-26 MED ORDER — OMEPRAZOLE 20 MG PO CPDR: 20.0000 mg | DELAYED_RELEASE_CAPSULE | Freq: Every day | ORAL | Status: DC

## 2014-08-26 NOTE — Assessment & Plan Note (Signed)
Daily chronic GERD. Last EGD 2006. Poorly controlled on famotidine and Zantac. Start omeprazole 20 mg daily. Patient like to have an upper endoscopy to evaluate his symptoms. In addition he needs one for esophageal varices screening.  I have discussed the risks, alternatives, benefits with regards to but not limited to the risk of reaction to medication, bleeding, infection, perforation and the patient is agreeable to proceed. Written consent to be obtained.

## 2014-08-26 NOTE — Patient Instructions (Signed)
1. Start omeprazole once daily before breakfast for heartburn. You can stop famotidine couple of days after starting omeprazole. 2. Upper endoscopy as scheduled.  3. You will be due for labs and abdominal ultrasound in 11/2014. 4. Next office visit in 05/2015.
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
## 2014-08-26 NOTE — Assessment & Plan Note (Signed)
Stable. Ferritin in acceptable range. Continue to monitor.
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
## 2014-09-09 ENCOUNTER — Telehealth: Payer: Self-pay | Admitting: Internal Medicine

## 2014-09-09 NOTE — Telephone Encounter (Signed)
September recall for abd ultrasound

## 2014-09-09 NOTE — Telephone Encounter (Signed)
Letter in the mail 

## 2014-09-15 ENCOUNTER — Telehealth: Payer: Self-pay | Admitting: General Practice

## 2014-09-15 NOTE — Telephone Encounter (Signed)
Pt has been rescheduled to 09/23/2014. Pt is aware

## 2014-09-15 NOTE — Telephone Encounter (Signed)
Per Anitra with [facility] the patient called and stated he would like to cancel his appointment and will call back to reschedule.  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

## 2014-10-10 ENCOUNTER — Ambulatory Visit (HOSPITAL_COMMUNITY): Admission: RE | Admit: 2014-10-10 | Payer: Medicare Other | Source: Ambulatory Visit | Admitting: Internal Medicine

## 2014-10-10 ENCOUNTER — Encounter (HOSPITAL_COMMUNITY): Admission: RE | Payer: Self-pay | Source: Ambulatory Visit

## 2014-10-10 SURGERY — EGD (ESOPHAGOGASTRODUODENOSCOPY)
Anesthesia: Moderate Sedation

## 2014-10-17 ENCOUNTER — Other Ambulatory Visit: Payer: Self-pay

## 2014-10-17 DIAGNOSIS — B182 Chronic viral hepatitis C: Secondary | ICD-10-CM

## 2014-11-14 DIAGNOSIS — B182 Chronic viral hepatitis C: Secondary | ICD-10-CM | POA: Diagnosis not present

## 2014-11-17 LAB — HEPATITIS C RNA QUANTITATIVE: HCV QUANT: NOT DETECTED [IU]/mL (ref <15)

## 2014-11-18 ENCOUNTER — Other Ambulatory Visit: Payer: Self-pay | Admitting: Gastroenterology

## 2014-11-18 DIAGNOSIS — K7460 Unspecified cirrhosis of liver: Secondary | ICD-10-CM

## 2014-11-18 NOTE — Progress Notes (Signed)
Quick Note:  Patient was supposed to have other labs too, see result note for labs in 07/2014.  Due for abd u/s for hepatoma screening as well. Please let him know that HCV negative. Will recheck one more time in 05/2015, CBC, iron/TIBC, ferritin,CMET, PT/INR. Please nic. ______

## 2014-11-20 ENCOUNTER — Other Ambulatory Visit: Payer: Self-pay

## 2014-11-20 DIAGNOSIS — C220 Liver cell carcinoma: Secondary | ICD-10-CM

## 2014-11-27 ENCOUNTER — Ambulatory Visit (HOSPITAL_COMMUNITY): Admission: RE | Admit: 2014-11-27 | Payer: Medicare Other | Source: Ambulatory Visit

## 2014-12-25 DIAGNOSIS — Z23 Encounter for immunization: Secondary | ICD-10-CM | POA: Diagnosis not present

## 2015-04-19 IMAGING — US US ABDOMEN COMPLETE
1 series · 13 of 25 positions shown · non-contrast
Comparison: Ultrasound of the abdomen of 10/10/2012

CLINICAL DATA: History of hepatitis-C, hepatoma surveillance

EXAM:
ULTRASOUND ABDOMEN COMPLETE

[Series 1: us abdomen complete · 0.18mm/px · 13 of 134 slices shown]
[im 1/134]
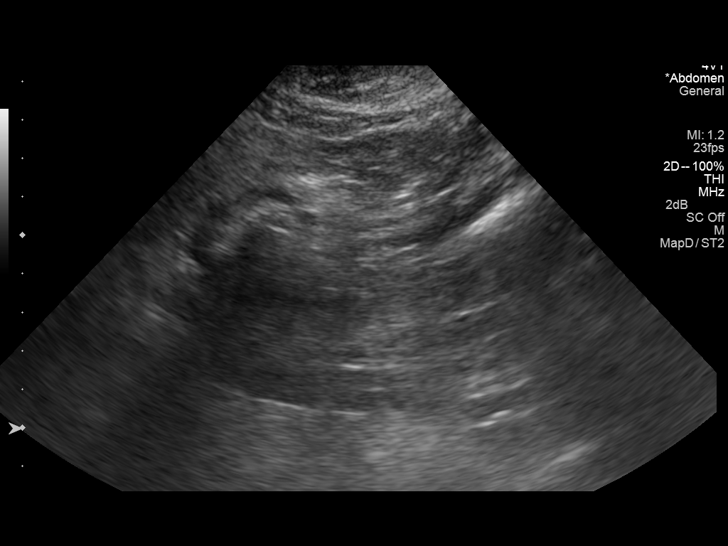
[im 12/134]
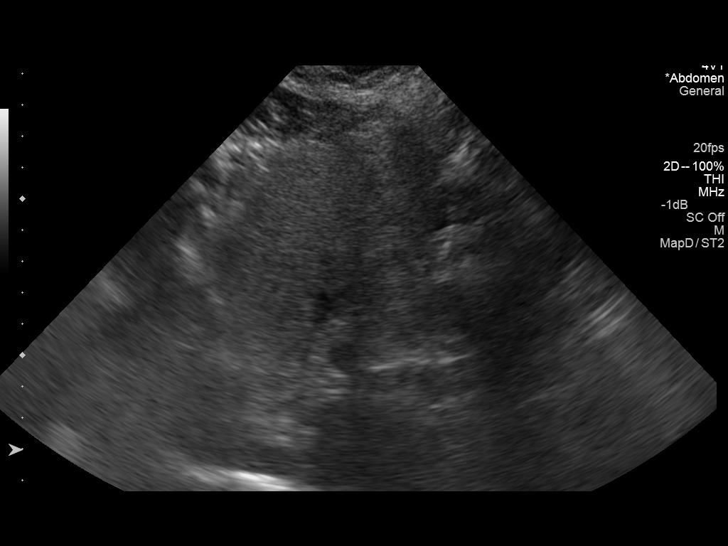
[im 23/134]
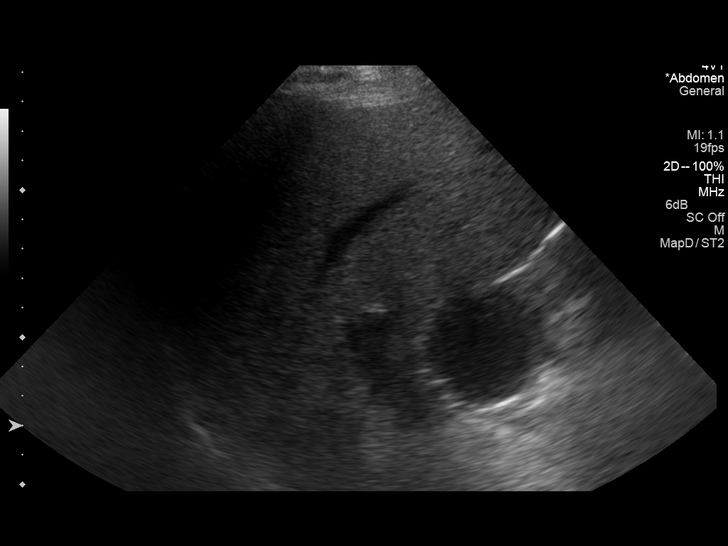
[im 34/134]
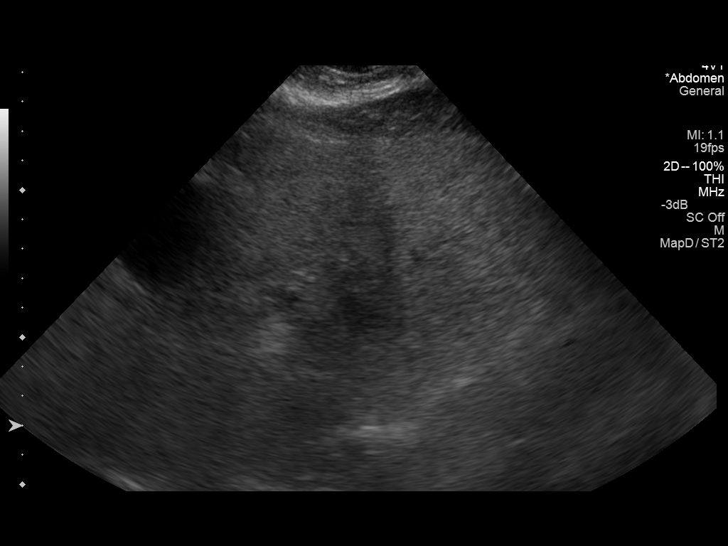
[im 45/134]
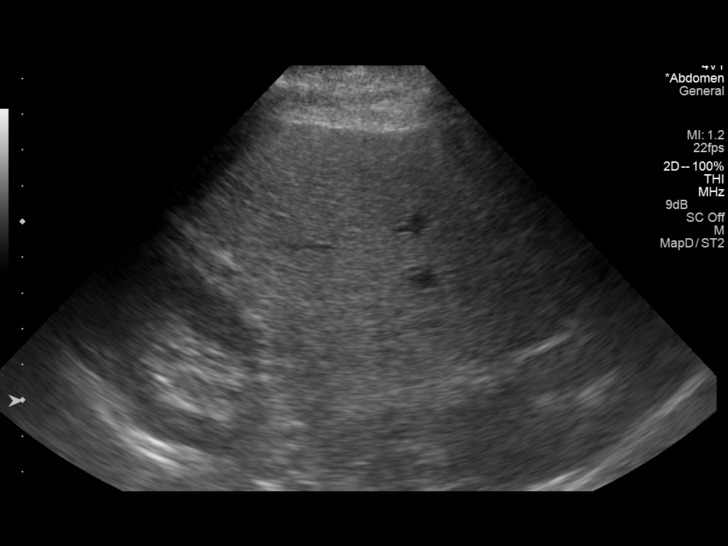
[im 56/134]
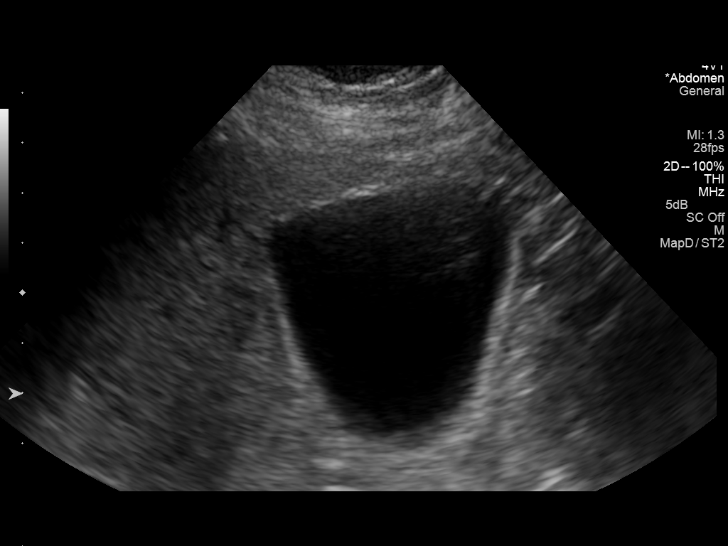
[im 67/134]
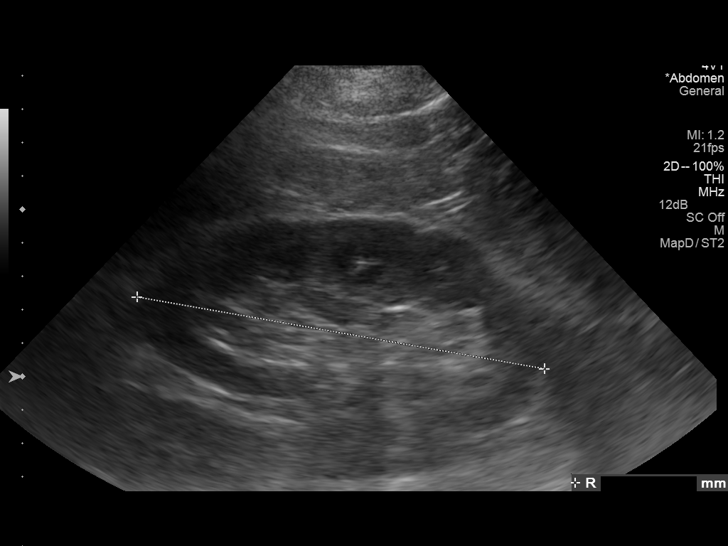
[im 78/134]
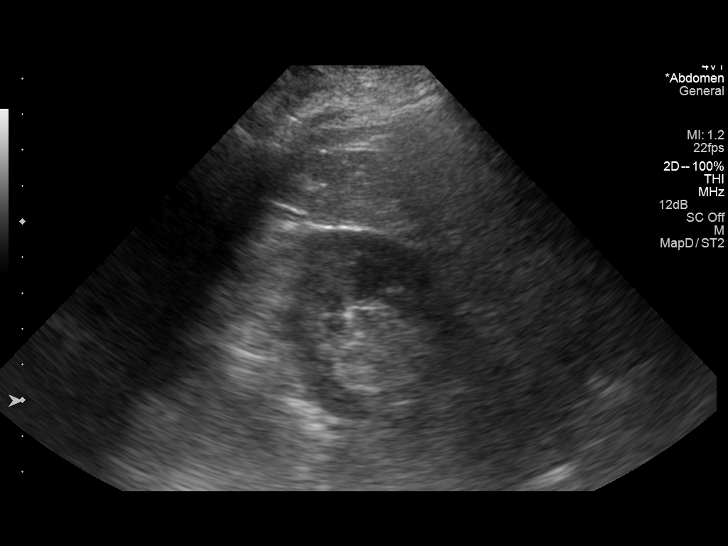
[im 89/134]
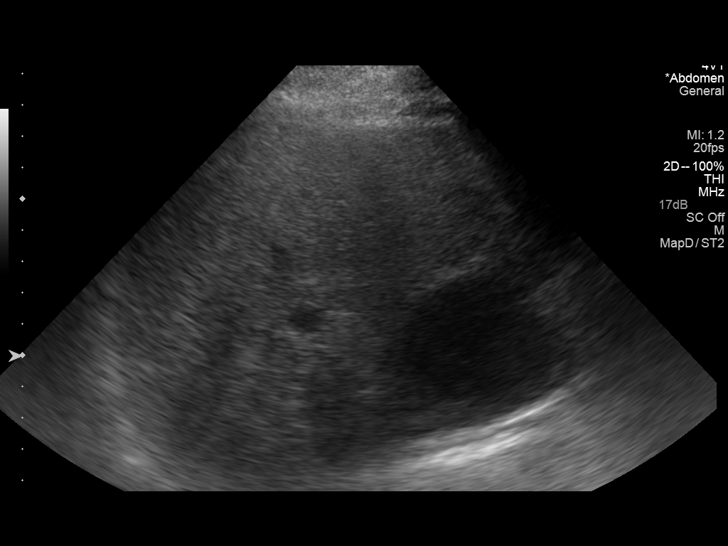
[im 100/134]
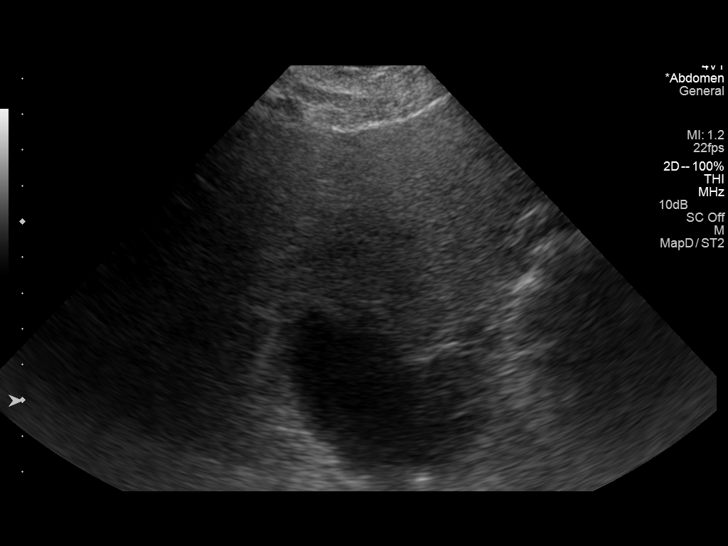
[im 111/134]
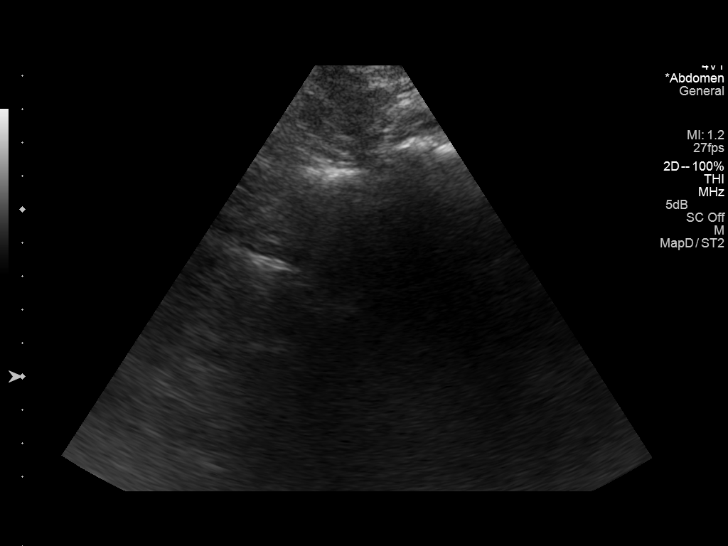
[im 122/134]
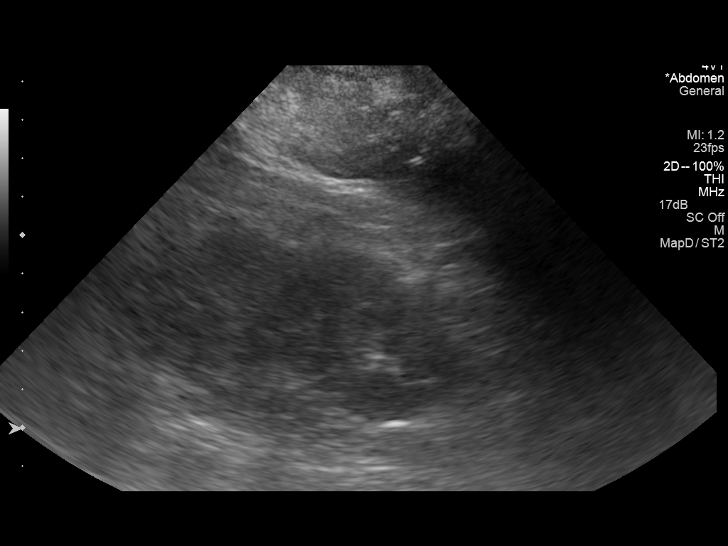
[im 134/134]
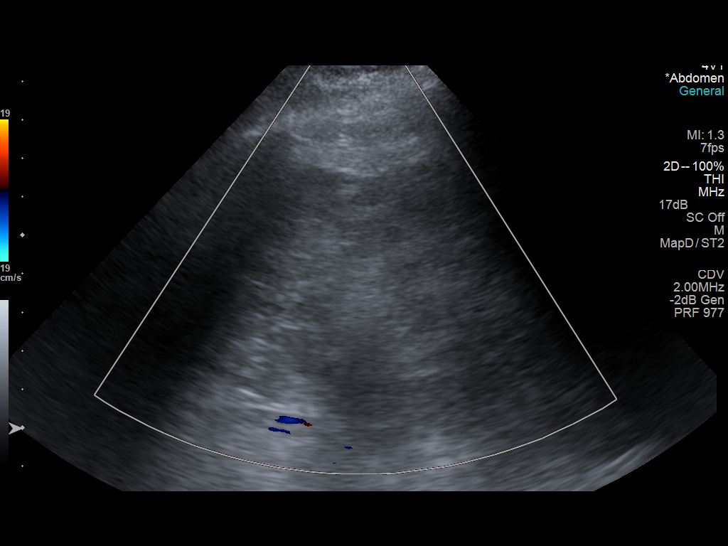

[13 of 25 positions shown; findings below may reference images not displayed]

FINDINGS: Gallbladder:

The gallbladder is visualized and no gallstones are noted. There is
no pain over the gallbladder with compression.

Common bile duct:

Diameter: The common bile duct is normal measuring 5.5 mm in
diameter.

Liver:

The liver is diffusely inhomogeneous and echogenic consistent with
fatty infiltration. No focal hepatic abnormality is seen. The liver
measures 15.9 cm sagittally.

IVC:

No abnormality visualized.

Pancreas:

The pancreas is largely obscured by bowel gas.

Spleen:

The spleen has previously been resected.

Right Kidney:

Length: 12.4 cm..  No hydronephrosis is seen.

Left Kidney:

Length: 13.0 cm..  No hydronephrosis is noted.

Abdominal aorta:

The abdominal aorta is partially obscured by bowel gas and body
habitus

Other findings:

There may be a pleural effusion on the right.
IMPRESSION: 1. Diffusely inhomogeneous and echogenic liver parenchyma consistent
with fatty infiltration. No focal hepatic abnormality is seen.
2. Bowel gas and body habitus obscures some anatomic detail, with
the pancreas and portions of the abdominal aorta not well seen.
3. Question of right pleural effusion.
4. Prior splenectomy.

## 2015-05-05 ENCOUNTER — Encounter: Payer: Self-pay | Admitting: Internal Medicine

## 2015-05-08 ENCOUNTER — Other Ambulatory Visit: Payer: Self-pay

## 2015-05-08 DIAGNOSIS — K7460 Unspecified cirrhosis of liver: Secondary | ICD-10-CM

## 2015-09-02 DIAGNOSIS — K219 Gastro-esophageal reflux disease without esophagitis: Secondary | ICD-10-CM | POA: Diagnosis not present

## 2015-09-02 DIAGNOSIS — Z6828 Body mass index (BMI) 28.0-28.9, adult: Secondary | ICD-10-CM | POA: Diagnosis not present

## 2015-09-02 DIAGNOSIS — I10 Essential (primary) hypertension: Secondary | ICD-10-CM | POA: Diagnosis not present

## 2015-09-02 DIAGNOSIS — Z0000 Encounter for general adult medical examination without abnormal findings: Secondary | ICD-10-CM | POA: Diagnosis not present

## 2015-09-02 DIAGNOSIS — Z8619 Personal history of other infectious and parasitic diseases: Secondary | ICD-10-CM | POA: Diagnosis not present

## 2015-09-02 DIAGNOSIS — M67442 Ganglion, left hand: Secondary | ICD-10-CM | POA: Diagnosis not present

## 2015-09-15 ENCOUNTER — Ambulatory Visit (INDEPENDENT_AMBULATORY_CARE_PROVIDER_SITE_OTHER): Payer: Medicare Other | Admitting: Gastroenterology

## 2015-09-15 ENCOUNTER — Other Ambulatory Visit: Payer: Self-pay

## 2015-09-15 ENCOUNTER — Encounter: Payer: Self-pay | Admitting: Gastroenterology

## 2015-09-15 VITALS — BP 143/92 | HR 90 | Temp 98.2°F | Ht 71.0 in | Wt 197.6 lb

## 2015-09-15 DIAGNOSIS — K7460 Unspecified cirrhosis of liver: Secondary | ICD-10-CM

## 2015-09-15 DIAGNOSIS — Z8619 Personal history of other infectious and parasitic diseases: Secondary | ICD-10-CM | POA: Diagnosis not present

## 2015-09-15 DIAGNOSIS — I8500 Esophageal varices without bleeding: Secondary | ICD-10-CM

## 2015-09-15 NOTE — Assessment & Plan Note (Addendum)
Ferritin up to 350. Would recommend phlebotomy at this time. Of note patient has history of chronic hepatitis C, status post eradication in 2016. F4 score previously on Fibrosure test at the hepatitis clinic. Recommendations were to treat patient as if he has cirrhosis as far as hepatoma screening guidelines, etc.  Plan on upper endoscopy in the near future. I have discussed the risks, alternatives, benefits with regards to but not limited to the risk of reaction to medication, bleeding, infection, perforation and the patient is agreeable to proceed. Written consent to be obtained.  Phlebotomy to be scheduled. We will check HCV RNA quantitative level at time of his phlebotomy, if negative he will need no further follow-up of HCV RNA unless he has concern for re-exposure.  Right upper quadrant ultrasound to screen for hepatoma.  Return to the office to see Dr. Gala Romney in 6 months for follow-up.

## 2015-09-15 NOTE — Patient Instructions (Signed)
1. We will schedule you for a phlebotomy in the upcoming future. 2. Upper endoscopy as scheduled to look for any evidence of cirrhosis such as enlarged blood vessels along the esophagus or stomach. 3. Abdominal ultrasound to reevaluate your liver. You should have this done at least twice per year along with labs because of your history of prior hepatitis C, hemochromatosis. 4. Return to the office in 6 months or sooner if needed

## 2015-09-15 NOTE — Addendum Note (Signed)
Addended by: Mahala Menghini on: 09/15/2015 02:38 PM   Modules accepted: Orders

## 2015-09-15 NOTE — Progress Notes (Signed)
cc'ed to pcp °
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
## 2015-09-16 ENCOUNTER — Telehealth: Payer: Self-pay | Admitting: Gastroenterology

## 2015-09-16 NOTE — Telephone Encounter (Signed)
Almyra Free, can we find out why this was cancelled?

## 2015-09-16 NOTE — Telephone Encounter (Signed)
-----   Message from [place], [facility] [place] sent at 09/16/2015  8:08 AM EDT ----- Regarding: Cancellation of Order # [id number] Order number [id number] for the procedure HCV RNA QUANT [[id number]]  has been canceled by [facility], [facility] [[email or web address]]. This  procedure was ordered by Mahala Menghini, PA-C [id number] on  Sep 15, 2015 for the patient Kyle [name] [id number]. The  reason for cancellation was "Test request received without  appropriate specimen".

## 2015-09-21 ENCOUNTER — Ambulatory Visit (HOSPITAL_COMMUNITY)
Admission: RE | Admit: 2015-09-21 | Discharge: 2015-09-21 | Disposition: A | Discharge status: Home / Self Care | Payer: Medicare Other | Source: Ambulatory Visit | Attending: Gastroenterology | Admitting: Gastroenterology

## 2015-09-21 DIAGNOSIS — Z8619 Personal history of other infectious and parasitic diseases: Secondary | ICD-10-CM | POA: Insufficient documentation

## 2015-09-21 DIAGNOSIS — K7460 Unspecified cirrhosis of liver: Secondary | ICD-10-CM | POA: Diagnosis present

## 2015-09-21 DIAGNOSIS — Z8719 Personal history of other diseases of the digestive system: Secondary | ICD-10-CM | POA: Diagnosis not present

## 2015-09-23 ENCOUNTER — Encounter (HOSPITAL_COMMUNITY): Payer: Self-pay | Admitting: *Deleted

## 2015-09-23 ENCOUNTER — Encounter (HOSPITAL_COMMUNITY)
Admission: RE | Disposition: A | Discharge status: Home / Self Care | Payer: Self-pay | Source: Ambulatory Visit | Attending: Internal Medicine

## 2015-09-23 ENCOUNTER — Ambulatory Visit (HOSPITAL_COMMUNITY)
Admission: RE | Admit: 2015-09-23 | Discharge: 2015-09-23 | Disposition: A | Discharge status: Home / Self Care | Payer: Medicare Other | Source: Ambulatory Visit | Attending: Internal Medicine | Admitting: Internal Medicine

## 2015-09-23 DIAGNOSIS — I10 Essential (primary) hypertension: Secondary | ICD-10-CM | POA: Insufficient documentation

## 2015-09-23 DIAGNOSIS — K449 Diaphragmatic hernia without obstruction or gangrene: Secondary | ICD-10-CM | POA: Diagnosis not present

## 2015-09-23 DIAGNOSIS — Z79899 Other long term (current) drug therapy: Secondary | ICD-10-CM | POA: Insufficient documentation

## 2015-09-23 DIAGNOSIS — K209 Esophagitis, unspecified: Secondary | ICD-10-CM | POA: Diagnosis not present

## 2015-09-23 DIAGNOSIS — Z8619 Personal history of other infectious and parasitic diseases: Secondary | ICD-10-CM | POA: Diagnosis not present

## 2015-09-23 DIAGNOSIS — I8500 Esophageal varices without bleeding: Secondary | ICD-10-CM

## 2015-09-23 DIAGNOSIS — K766 Portal hypertension: Secondary | ICD-10-CM | POA: Diagnosis not present

## 2015-09-23 DIAGNOSIS — K3189 Other diseases of stomach and duodenum: Secondary | ICD-10-CM | POA: Diagnosis not present

## 2015-09-23 DIAGNOSIS — Z13810 Encounter for screening for upper gastrointestinal disorder: Secondary | ICD-10-CM | POA: Diagnosis not present

## 2015-09-23 DIAGNOSIS — R7989 Other specified abnormal findings of blood chemistry: Secondary | ICD-10-CM | POA: Insufficient documentation

## 2015-09-23 HISTORY — PX: ESOPHAGOGASTRODUODENOSCOPY: SHX5428

## 2015-09-23 SURGERY — EGD (ESOPHAGOGASTRODUODENOSCOPY)
Anesthesia: Moderate Sedation

## 2015-09-23 MED ORDER — LIDOCAINE VISCOUS 2 % MT SOLN
OROMUCOSAL | Status: DC | PRN
  Administered 2015-09-23: 3 mL via OROMUCOSAL

## 2015-09-23 MED ORDER — MIDAZOLAM HCL 5 MG/5ML IJ SOLN
INTRAMUSCULAR | Status: AC
  Filled 2015-09-23: qty 10

## 2015-09-23 MED ORDER — ONDANSETRON HCL 4 MG/2ML IJ SOLN
INTRAMUSCULAR | Status: AC
  Filled 2015-09-23: qty 2

## 2015-09-23 MED ORDER — ONDANSETRON HCL 4 MG/2ML IJ SOLN
INTRAMUSCULAR | Status: DC | PRN
  Administered 2015-09-23: 4 mg via INTRAVENOUS

## 2015-09-23 MED ORDER — MEPERIDINE HCL 100 MG/ML IJ SOLN
INTRAMUSCULAR | Status: AC
  Filled 2015-09-23: qty 2

## 2015-09-23 MED ORDER — SODIUM CHLORIDE 0.9 % IV SOLN
INTRAVENOUS | Status: DC
  Administered 2015-09-23: 1000 mL via INTRAVENOUS

## 2015-09-23 MED ORDER — MIDAZOLAM HCL 5 MG/5ML IJ SOLN
INTRAMUSCULAR | Status: DC | PRN
  Administered 2015-09-23 (×2): 2 mg via INTRAVENOUS

## 2015-09-23 MED ORDER — MEPERIDINE HCL 100 MG/ML IJ SOLN
INTRAMUSCULAR | Status: DC | PRN
  Administered 2015-09-23 (×2): 50 mg via INTRAVENOUS

## 2015-09-23 MED ORDER — STERILE WATER FOR IRRIGATION IR SOLN
Status: DC | PRN
  Administered 2015-09-23: 14:00:00

## 2015-09-23 MED ORDER — LIDOCAINE VISCOUS 2 % MT SOLN
OROMUCOSAL | Status: AC
  Filled 2015-09-23: qty 15

## 2015-09-23 NOTE — Op Note (Signed)
[facility] Patient Name: Kyle [name] Procedure Date: 09/23/2015 2:05 PM MRN: [id number] Date of Birth: 04-Nov-1955 Attending MD: Norvel Richards , MD CSN: [id number] Age: 60 Admit Type: Outpatient Procedure:                Upper GI endoscopy - diagnostic Indications:              Screening procedure Providers:                Norvel Richards, MD, Otis Peak B. Gwenlyn Perking RN, RN,                            Isabella Stalling, Technician Referring MD:              Medicines:                Midazolam 4 mg IV, Meperidine 100 mg IV,                            Ondansetron 4 mg IV Complications:            No immediate complications. Estimated Blood Loss:     Estimated blood loss: none. Procedure:                Pre-Anesthesia Assessment:                           - Prior to the procedure, a History and Physical                            was performed, and patient medications and                            allergies were reviewed. The patient's tolerance of                            previous anesthesia was also reviewed. The risks                            and benefits of the procedure and the sedation                            options and risks were discussed with the patient.                            All questions were answered, and informed consent                            was obtained. Prior Anticoagulants: The patient has                            taken no previous anticoagulant or antiplatelet                            agents. ASA Grade Assessment: II - A patient with  mild systemic disease. After reviewing the risks                            and benefits, the patient was deemed in                            satisfactory condition to undergo the procedure.                           After obtaining informed consent, the endoscope was                            passed under direct vision. Throughout the                            procedure, the  patient's blood pressure, pulse, and                            oxygen saturations were monitored continuously. The                            [id number]) scope was introduced through the                            mouth, and advanced to the second part of duodenum.                            The upper GI endoscopy was accomplished without                            difficulty. The patient tolerated the procedure                            well. The upper GI endoscopy was accomplished                            without difficulty. The patient tolerated the                            procedure well. Scope In: 2:17:30 PM Scope Out: 2:20:31 PM Total Procedure Duration: 0 hours 3 minutes 1 second  Findings:      LA Grade D (one or more mucosal breaks involving at least 75% of       esophageal circumference) esophagitis was found 30 to 37 cm from the       incisors. No esophageal varices found.      A large hiatal hernia was present.      The duodenal bulb and second portion of the duodenum were normal.      Moderate portal hypertensive gastropathy was found in the stomach. Impression:               - LA Grade D esophagitis.                           - Large hiatal hernia.                           -  Normal duodenal bulb and second portion of the                            duodenum.                           - Portal hypertensive gastropathy.                           - No specimens collected. Moderate Sedation:      Moderate (conscious) sedation was administered by the endoscopy nurse       and supervised by the endoscopist. The following parameters were       monitored: oxygen saturation, heart rate, blood pressure, respiratory       rate, EKG, adequacy of pulmonary ventilation, and response to care.       Total physician intraservice time was 13 minutes. Recommendation:           - Patient has a contact number available for                            emergencies. The signs and  symptoms of potential                            delayed complications were discussed with the                            patient. Return to normal activities tomorrow.                            Written discharge instructions were provided to the                            patient.                           - Advance diet as tolerated. Stop famotidine/Pepcid                           - Use Protonix (pantoprazole) 40 mg PO BID today.                            Office visit with [place] in 6 months. Repeat EGD in 2                            years.                           - Continue present medications. Procedure Code(s):        --- Professional ---                           [phone], Esophagogastroduodenoscopy, flexible,                            transoral; diagnostic, including collection of  specimen(s) by brushing or washing, when performed                            (separate procedure)                           99152, Moderate sedation services provided by the                            same physician or other qualified health care                            professional performing the diagnostic or                            therapeutic service that the sedation supports,                            requiring the presence of an independent trained                            observer to assist in the monitoring of the                            patient's level of consciousness and physiological                            status; initial 15 minutes of intraservice time,                            patient age 79 years or older Diagnosis Code(s):        --- Professional ---                           K20.9, Esophagitis, unspecified                           K44.9, Diaphragmatic hernia without obstruction or                            gangrene                           K76.6, Portal hypertension                           K31.89, Other diseases of stomach and duodenum                            Z13.810, Encounter for screening for upper                            gastrointestinal disorder CPT copyright 2016 American Medical Association. All rights reserved. The codes documented in this report are preliminary and upon coder review may  be revised to meet current compliance requirements. Cristopher Estimable. Gala Romney, MD Norvel Richards, MD  09/23/2015 2:31:00 PM This report has been signed electronically. Number of Addenda: 0

## 2015-09-23 NOTE — Interval H&P Note (Signed)
History and Physical Interval Note:  09/23/2015 2:03 PM  Kyle [name]  has presented today for surgery, with the diagnosis of SCREENING FOR ESOPH VARICES  The various methods of treatment have been discussed with the patient and family. After consideration of risks, benefits and other options for treatment, the patient has consented to  Procedure(s) with comments: ESOPHAGOGASTRODUODENOSCOPY (EGD) (N/A) - 200 as a surgical intervention .  The patient's history has been reviewed, patient examined, no change in status, stable for surgery.  I have reviewed the patient's chart and labs.  Questions were answered to the patient's satisfaction.     Kyle [name]  No change. Screening EGD per plan.  The risks, benefits, limitations, alternatives and imponderables have been reviewed with the patient. Potential for esophageal dilation, biopsy, etc. have also been reviewed.  Questions have been answered. All parties agreeable.

## 2015-09-23 NOTE — Discharge Instructions (Addendum)
EGD Discharge instructions Please read the instructions outlined below and refer to this sheet in the next few weeks. These discharge instructions provide you with general information on caring for yourself after you leave the hospital. Your doctor may also give you specific instructions. While your treatment has been planned according to the most current medical practices available, unavoidable complications occasionally occur. If you have any problems or questions after discharge, please call your doctor. ACTIVITY  You may resume your regular activity but move at a slower pace for the next 24 hours.   Take frequent rest periods for the next 24 hours.   Walking will help expel (get rid of) the air and reduce the bloated feeling in your abdomen.   No driving for 24 hours (because of the anesthesia (medicine) used during the test).   You may shower.   Do not sign any important legal documents or operate any machinery for 24 hours (because of the anesthesia used during the test).  NUTRITION  Drink plenty of fluids.   You may resume your normal diet.   Begin with a light meal and progress to your normal diet.   Avoid alcoholic beverages for 24 hours or as instructed by your caregiver.  MEDICATIONS  You may resume your normal medications unless your caregiver tells you otherwise.  WHAT YOU CAN EXPECT TODAY  You may experience abdominal discomfort such as a feeling of fullness or gas pains.  FOLLOW-UP  Your doctor will discuss the results of your test with you.  SEEK IMMEDIATE MEDICAL ATTENTION IF ANY OF THE FOLLOWING OCCUR:  Excessive nausea (feeling sick to your stomach) and/or vomiting.   Severe abdominal pain and distention (swelling).   Trouble swallowing.   Temperature over 101 F (37.8 C).   Rectal bleeding or vomiting of blood.    GERD information provided  Stop famotidine/Pepcid  Begin Protonix 40 mg twice daily  Office visit with [place] in 6 months  Repeat  EGD in 2 years.     Gastroesophageal Reflux Disease, Adult Normally, food travels down the esophagus and stays in the stomach to be digested. However, when a person has gastroesophageal reflux disease (GERD), food and stomach acid move back up into the esophagus. When this happens, the esophagus becomes sore and inflamed. Over time, GERD can create small holes (ulcers) in the lining of the esophagus.  CAUSES This condition is caused by a problem with the muscle between the esophagus and the stomach (lower esophageal sphincter, or LES). Normally, the LES muscle closes after food passes through the esophagus to the stomach. When the LES is weakened or abnormal, it does not close properly, and that allows food and stomach acid to go back up into the esophagus. The LES can be weakened by certain dietary substances, medicines, and medical conditions, including:  Tobacco use.  Pregnancy.  Having a hiatal hernia.  Heavy alcohol use.  Certain foods and beverages, such as coffee, chocolate, onions, and peppermint. RISK FACTORS This condition is more likely to develop in:  People who have an increased body weight.  People who have connective tissue disorders.  People who use NSAID medicines. SYMPTOMS Symptoms of this condition include:  Heartburn.  Difficult or painful swallowing.  The feeling of having a lump in the throat.  Abitter taste in the mouth.  Bad breath.  Having a large amount of saliva.  Having an upset or bloated stomach.  Belching.  Chest pain.  Shortness of breath or wheezing.  Ongoing (chronic) cough  or a night-time cough.  Wearing away of tooth enamel.  Weight loss. Different conditions can cause chest pain. Make sure to see your health care provider if you experience chest pain. DIAGNOSIS Your health care provider will take a medical history and perform a physical exam. To determine if you have mild or severe GERD, your health care provider may also  monitor how you respond to treatment. You may also have other tests, including:  An endoscopy toexamine your stomach and esophagus with a small camera.  A test thatmeasures the acidity level in your esophagus.  A test thatmeasures how much pressure is on your esophagus.  A barium swallow or modified barium swallow to show the shape, size, and functioning of your esophagus. TREATMENT The goal of treatment is to help relieve your symptoms and to prevent complications. Treatment for this condition may vary depending on how severe your symptoms are. Your health care provider may recommend:  Changes to your diet.  Medicine.  Surgery. HOME CARE INSTRUCTIONS Diet  Follow a diet as recommended by your health care provider. This may involve avoiding foods and drinks such as:  Coffee and tea (with or without caffeine).  Drinks that containalcohol.  Energy drinks and sports drinks.  Carbonated drinks or sodas.  Chocolate and cocoa.  Peppermint and mint flavorings.  Garlic and onions.  Horseradish.  Spicy and acidic foods, including peppers, chili powder, curry powder, vinegar, hot sauces, and barbecue sauce.  Citrus fruit juices and citrus fruits, such as oranges, lemons, and limes.  Tomato-based foods, such as red sauce, chili, salsa, and pizza with red sauce.  Fried and fatty foods, such as donuts, french fries, potato chips, and high-fat dressings.  High-fat meats, such as hot dogs and fatty cuts of red and white meats, such as rib eye steak, sausage, ham, and bacon.  High-fat dairy items, such as whole milk, butter, and cream cheese.  Eat small, frequent meals instead of large meals.  Avoid drinking large amounts of liquid with your meals.  Avoid eating meals during the 2-3 hours before bedtime.  Avoid lying down right after you eat.  Do not exercise right after you eat. General Instructions  Pay attention to any changes in your symptoms.  Take  over-the-counter and prescription medicines only as told by your health care provider. Do not take aspirin, ibuprofen, or other NSAIDs unless your health care provider told you to do so.  Do not use any tobacco products, including cigarettes, chewing tobacco, and e-cigarettes. If you need help quitting, ask your health care provider.  Wear loose-fitting clothing. Do not wear anything tight around your waist that causes pressure on your abdomen.  Raise (elevate) the head of your bed 6 inches (15cm).  Try to reduce your stress, such as with yoga or meditation. If you need help reducing stress, ask your health care provider.  If you are overweight, reduce your weight to an amount that is healthy for you. Ask your health care provider for guidance about a safe weight loss goal.  Keep all follow-up visits as told by your health care provider. This is important. SEEK MEDICAL CARE IF:  You have new symptoms.  You have unexplained weight loss.  You have difficulty swallowing, or it hurts to swallow.  You have wheezing or a persistent cough.  Your symptoms do not improve with treatment.  You have a hoarse voice. SEEK IMMEDIATE MEDICAL CARE IF:  You have pain in your arms, neck, jaw, teeth, or back.  You feel sweaty, dizzy, or light-headed.  You have chest pain or shortness of breath.  You vomit and your vomit looks like blood or coffee grounds.  You faint.  Your stool is bloody or black.  You cannot swallow, drink, or eat.   This information is not intended to replace advice given to you by your health care provider. Make sure you discuss any questions you have with your health care provider.   Document Released: 11/03/2004 Document Revised: 10/15/2014 Document Reviewed: 05/21/2014 Elsevier Interactive Patient Education [employer].
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
## 2015-09-24 ENCOUNTER — Other Ambulatory Visit: Payer: Self-pay | Admitting: Gastroenterology

## 2015-09-24 DIAGNOSIS — B182 Chronic viral hepatitis C: Secondary | ICD-10-CM

## 2015-09-24 NOTE — Telephone Encounter (Signed)
Kyle [name], is this still in your box. Patient goes for phlebotomy tomorrow so it be nice to have this done at that time if possible.

## 2015-09-24 NOTE — Telephone Encounter (Signed)
No one in endo at this time. I have done the lab orders and faxed them to endo with a note asking them to draw this for the pt when he comes in for his phlebotomy.  Routing to Ginger for FYI in case they call from endo in the morning.

## 2015-09-25 ENCOUNTER — Encounter (HOSPITAL_COMMUNITY)
Admission: RE | Admit: 2015-09-25 | Discharge: 2015-09-25 | Disposition: A | Discharge status: Home / Self Care | Payer: Medicare Other | Source: Ambulatory Visit | Attending: Gastroenterology | Admitting: Gastroenterology

## 2015-09-25 ENCOUNTER — Encounter (HOSPITAL_COMMUNITY): Payer: Self-pay | Admitting: Internal Medicine

## 2015-09-25 NOTE — Telephone Encounter (Signed)
Talked with Barnett Applebaum and she has the orders. She will send [place] the results when they are back

## 2015-09-30 ENCOUNTER — Encounter (HOSPITAL_COMMUNITY)
Admission: RE | Admit: 2015-09-30 | Discharge: 2015-09-30 | Disposition: A | Discharge status: Home / Self Care | Payer: Medicare Other | Source: Ambulatory Visit | Attending: Gastroenterology | Admitting: Gastroenterology

## 2015-09-30 ENCOUNTER — Encounter (HOSPITAL_COMMUNITY): Payer: Self-pay

## 2015-09-30 NOTE — Progress Notes (Signed)
Kyle [name] presents today for phlebotomy per MD orders. Along with lab work. Drawn and sent to lab. HGB/HCT:17.6/50.6 Ferritin  Level 357 Phlebotomy procedure started at 1331 and ended at 1338. 25 ounces removed. Patient tolerated procedure well. IV needle removed intact.

## 2015-10-01 LAB — HCV RNA QUANT: HCV QUANT: NOT DETECTED [IU]/mL (ref >50)

## 2015-10-05 NOTE — Progress Notes (Signed)
Results for Kyle [name], METH (MRN [id number]) as of 10/05/2015 14:36  Ref. Range 09/30/2015 13:23  Test Information Unknown Comment  HCV Quantitative Latest Ref Range: >50 IU/mL HCV Not Detected

## 2015-10-06 NOTE — Progress Notes (Signed)
Please let patient know his HCV RNA was negative.

## 2015-10-06 NOTE — Progress Notes (Signed)
Please let patient know his liver appears stable on u/s. No masses. No overt cirrhosis but previous elastography with high level fibrosis. Continue cirrhosis care for now (ie hepatoma screening/labs twice yearly). Patient to have next EGD in 2 years per RMR recommendations at time of recent egd.  1# OV in six months.  2# RUQ u/s in six months for hepatoma screening 3# repeat phlebotomy in four weeks, needs cbc, ferritin prior to. 4# CBC, CMET, PT/INR, ferritin in six months prior to OV.

## 2015-10-20 ENCOUNTER — Other Ambulatory Visit: Payer: Self-pay

## 2015-10-20 ENCOUNTER — Other Ambulatory Visit: Payer: Self-pay | Admitting: Gastroenterology

## 2015-10-20 DIAGNOSIS — K7460 Unspecified cirrhosis of liver: Secondary | ICD-10-CM

## 2015-12-07 ENCOUNTER — Telehealth: Payer: Self-pay | Admitting: Internal Medicine

## 2015-12-07 NOTE — Telephone Encounter (Signed)
PATIENT LOST LAB ORDER, CAN YOU PLEASE PRINT ONE AND HE WILL PICK IT UP WED MORNING HERE

## 2015-12-07 NOTE — Telephone Encounter (Signed)
Lab order at the front desk.

## 2015-12-09 ENCOUNTER — Other Ambulatory Visit (HOSPITAL_COMMUNITY)
Admission: RE | Admit: 2015-12-09 | Discharge: 2015-12-09 | Disposition: A | Discharge status: Home / Self Care | Payer: Medicare Other | Source: Ambulatory Visit | Attending: Gastroenterology | Admitting: Gastroenterology

## 2015-12-09 DIAGNOSIS — K7460 Unspecified cirrhosis of liver: Secondary | ICD-10-CM | POA: Insufficient documentation

## 2015-12-09 LAB — CBC WITH DIFFERENTIAL/PLATELET
Basophils Absolute: 0.1 10*3/uL (ref 0.0–0.1)
Basophils Relative: 1 %
EOS PCT: 1 %
Eosinophils Absolute: 0.1 10*3/uL (ref 0.0–0.7)
HCT: 52.8 % — ABNORMAL HIGH (ref 39.0–52.0)
Hemoglobin: 19 g/dL — ABNORMAL HIGH (ref 13.0–17.0)
LYMPHS ABS: 2.9 10*3/uL (ref 0.7–4.0)
LYMPHS PCT: 30 %
MCH: 36.7 pg — AB (ref 26.0–34.0)
MCHC: 36 g/dL (ref 30.0–36.0)
MCV: 101.9 fL — AB (ref 78.0–100.0)
MONO ABS: 1.3 10*3/uL — AB (ref 0.1–1.0)
Monocytes Relative: 13 %
Neutro Abs: 5.4 10*3/uL (ref 1.7–7.7)
Neutrophils Relative %: 55 %
PLATELETS: 347 10*3/uL (ref 150–400)
RBC: 5.18 MIL/uL (ref 4.22–5.81)
RDW: 12.8 % (ref 11.5–15.5)
WBC: 9.8 10*3/uL (ref 4.0–10.5)

## 2015-12-09 LAB — FERRITIN: Ferritin: 207 ng/mL (ref 24–336)

## 2015-12-17 NOTE — Progress Notes (Signed)
Please let patient know his ferritin is over 200. H/H up.  Please find out if he is drinking etoh and if so how much.  Needs phlebotomy, now and then repeat in four weeks. CBC, ferritin prior to the repeat phlebotomy.

## 2015-12-21 ENCOUNTER — Telehealth: Payer: Self-pay | Admitting: Internal Medicine

## 2015-12-21 NOTE — Telephone Encounter (Signed)
Tried to call pt- NA-LMOM- see result note.  

## 2015-12-21 NOTE — Telephone Encounter (Signed)
PT CALLED INQUIRING ABOUT BLOOD WORK RESULTS

## 2016-02-03 ENCOUNTER — Telehealth: Payer: Self-pay

## 2016-02-03 NOTE — Telephone Encounter (Signed)
Pt called office and wants to know if LSL wants him to have any lab work done for Hemochromatosis. If so, he can go 02/10/16. He is out of town at this time. His phone number is [phone]. Advised pt I would send message to LSL.

## 2016-02-04 NOTE — Telephone Encounter (Signed)
Tried to call pt- the number he left belongs to Sela Hua (listed as a friend of the pt), asked him to have the pt call me back. He said he would let him know.

## 2016-02-04 NOTE — Telephone Encounter (Signed)
Pt is due for lab work in March 2018. Do you want me to let pt know he is not due at this time?

## 2016-02-04 NOTE — Telephone Encounter (Signed)
Almyra Free called the patient multiple times in November and finally sent him a letter on November 15. He was supposed to have a phlebotomy at that time, repeated in [redacted] weeks along with blood work prior to the second phlebotomy. It looks like this has not been done.  Forwarding to [place] to help assist in making arrangements for phlebotomy. Please see orders/instructions provided on the 12/09/2015 lab results.

## 2016-02-05 NOTE — Telephone Encounter (Signed)
Pt called and he is going to have the blood work done on Wednesday.

## 2016-02-05 NOTE — Telephone Encounter (Signed)
Pt is wanting to go for phlebotomy on Wednesday- they do not have any new orders. I have redone the orders and faxed them to short stay with pts request for Wednesday.

## 2016-02-09 NOTE — Telephone Encounter (Signed)
noted 

## 2016-02-10 ENCOUNTER — Encounter (HOSPITAL_COMMUNITY)
Admission: RE | Admit: 2016-02-10 | Discharge: 2016-02-10 | Disposition: A | Discharge status: Home / Self Care | Payer: Medicare HMO | Source: Ambulatory Visit | Attending: Gastroenterology | Admitting: Gastroenterology

## 2016-02-10 NOTE — Progress Notes (Signed)
Kyle [name] presents today for phlebotomy per MD orders. HGB/HCT:19.1/52.8 Phlebotomy procedure started at 1342 and ended at 1348. 1 lb 5 oz removed. Patient tolerated procedure well. IV needle removed intact. Ginger-ale given to drink. Tolerated well.

## 2016-02-26 ENCOUNTER — Telehealth: Payer: Self-pay | Admitting: Gastroenterology

## 2016-02-26 NOTE — Telephone Encounter (Signed)
Labs from 08/2015 Ferritin 357, cre 0.9, sodium 132, tbili 1.6, AP 84, AST 26, ALT 17, alb 3.99, wbc 10.4, H/H 17.6/50.6, platelet 414

## 2016-03-08 ENCOUNTER — Encounter: Payer: Self-pay | Admitting: Internal Medicine

## 2016-03-09 ENCOUNTER — Encounter (HOSPITAL_COMMUNITY)
Admission: RE | Admit: 2016-03-09 | Discharge: 2016-03-09 | Disposition: A | Discharge status: Home / Self Care | Payer: Medicare HMO | Source: Ambulatory Visit | Attending: Gastroenterology | Admitting: Gastroenterology

## 2016-03-09 LAB — CBC
HEMATOCRIT: 47.6 % (ref 39.0–52.0)
HEMOGLOBIN: 16.4 g/dL (ref 13.0–17.0)
MCH: 35.2 pg — AB (ref 26.0–34.0)
MCHC: 34.5 g/dL (ref 30.0–36.0)
MCV: 102.1 fL — AB (ref 78.0–100.0)
PLATELETS: 363 10*3/uL (ref 150–400)
RBC: 4.66 MIL/uL (ref 4.22–5.81)
RDW: 13.7 % (ref 11.5–15.5)
WBC: 10.3 10*3/uL (ref 4.0–10.5)

## 2016-03-09 LAB — FERRITIN: Ferritin: 105 ng/mL (ref 24–336)

## 2016-03-09 NOTE — Progress Notes (Signed)
Kyle [name] presents today for phlebotomy per MD orders. HGB/HCT:16.4/47.6% Phlebotomy procedure started at 1040 and ended at 1048. 540cc cc removed. Patient tolerated procedure well. IV needle removed intact from R AC. Pt ate graham crackers and peanut butter and drank ginger ale before departure. He stated, "I feel fine."

## 2016-03-10 NOTE — Progress Notes (Signed)
Results for [name], [name] (MRN [id number]) as of 03/10/2016 12:35  Ref. Range 03/09/2016 10:29  Ferritin Latest Ref Range: 24 - 336 ng/mL 105  WBC Latest Ref Range: 4.0 - 10.5 K/uL 10.3  RBC Latest Ref Range: 4.22 - 5.81 MIL/uL 4.66  Hemoglobin Latest Ref Range: 13.0 - 17.0 g/dL 16.4  HCT Latest Ref Range: 39.0 - 52.0 % 47.6  MCV Latest Ref Range: 78.0 - 100.0 fL 102.1 (H)  MCH Latest Ref Range: 26.0 - 34.0 pg 35.2 (H)  MCHC Latest Ref Range: 30.0 - 36.0 g/dL 34.5  RDW Latest Ref Range: 11.5 - 15.5 % 13.7  Platelets Latest Ref Range: 150 - 400 K/uL 363  Kyle [name] presents today for phlebotomy per MD orders. HGB/HCT:16.4/47.6% Phlebotomy procedure started at 1040 and ended at 1048. 540cc cc removed. Patient tolerated procedure well. IV needle removed intact from R AC. Pt ate graham crackers and peanut butter and drank ginger ale before departure. He stated, "I feel fine."  This will complete the physician order sent from L. Bobby Rumpf, [place].

## 2016-03-14 NOTE — Progress Notes (Signed)
Please arrange for phlebotomy of 500cc of blood in 3 weeks.  Will need CBC, iron/tibc, ferritin, folate, B12 a couple of days prior to phlebotomy. Please advise patient that he should avoid alcohol. I am concerned that he may be drinking more than usual with MCV climbing. We will check B12 and folate at next labs.  Keep OV as scheduled.

## 2016-03-16 ENCOUNTER — Other Ambulatory Visit: Payer: Self-pay

## 2016-03-16 ENCOUNTER — Other Ambulatory Visit: Payer: Self-pay | Admitting: Gastroenterology

## 2016-03-18 ENCOUNTER — Telehealth: Payer: Self-pay | Admitting: Internal Medicine

## 2016-03-18 NOTE — Telephone Encounter (Signed)
Letter mailed

## 2016-03-18 NOTE — Telephone Encounter (Signed)
RECALL FOR ULTRASOUND 

## 2016-03-21 ENCOUNTER — Other Ambulatory Visit: Payer: Self-pay

## 2016-03-21 DIAGNOSIS — K7460 Unspecified cirrhosis of liver: Secondary | ICD-10-CM

## 2016-03-22 NOTE — Progress Notes (Signed)
Can we have the PT/INR and CMET done at time of the labs prior to phlebotomy.

## 2016-03-25 ENCOUNTER — Telehealth: Payer: Self-pay | Admitting: Gastroenterology

## 2016-03-25 ENCOUNTER — Encounter: Payer: Self-pay | Admitting: Gastroenterology

## 2016-03-25 ENCOUNTER — Ambulatory Visit: Payer: Medicare Other | Admitting: Gastroenterology

## 2016-03-25 NOTE — Telephone Encounter (Signed)
PT WAS A NO SHOW AND LETTER SENT  °

## 2016-03-28 ENCOUNTER — Ambulatory Visit: Payer: Medicare HMO | Admitting: Nurse Practitioner

## 2016-03-28 ENCOUNTER — Encounter: Payer: Self-pay | Admitting: Nurse Practitioner

## 2016-03-28 ENCOUNTER — Telehealth: Payer: Self-pay | Admitting: Nurse Practitioner

## 2016-03-28 NOTE — Telephone Encounter (Signed)
Noted  

## 2016-03-28 NOTE — Telephone Encounter (Signed)
Pt was a no show and letter sent  °

## 2016-04-07 ENCOUNTER — Encounter (HOSPITAL_COMMUNITY): Admission: RE | Admit: 2016-04-07 | Payer: Medicare HMO | Source: Ambulatory Visit

## 2016-04-11 ENCOUNTER — Encounter (HOSPITAL_COMMUNITY)
Admission: RE | Admit: 2016-04-11 | Discharge: 2016-04-11 | Disposition: A | Discharge status: Home / Self Care | Payer: Medicare HMO | Source: Ambulatory Visit | Attending: Gastroenterology | Admitting: Gastroenterology

## 2016-04-11 ENCOUNTER — Telehealth: Payer: Self-pay

## 2016-04-11 NOTE — Telephone Encounter (Signed)
Noted  

## 2016-04-11 NOTE — Telephone Encounter (Signed)
Kyle [name] from [facility] called- pt missed his phlebotomy last week and was rescheduled for today. Pt also has not done his blood work. If pt shows up for his phlebotomy today, Barnett Applebaum is going to draw his labs.   Routing to LSL for FYI.

## 2016-04-20 ENCOUNTER — Ambulatory Visit (INDEPENDENT_AMBULATORY_CARE_PROVIDER_SITE_OTHER): Payer: Medicare HMO | Admitting: Nurse Practitioner

## 2016-04-20 ENCOUNTER — Encounter (HOSPITAL_COMMUNITY)
Admission: RE | Admit: 2016-04-20 | Discharge: 2016-04-20 | Disposition: A | Discharge status: Home / Self Care | Payer: Medicare HMO | Source: Ambulatory Visit | Attending: Gastroenterology | Admitting: Gastroenterology

## 2016-04-20 ENCOUNTER — Encounter: Payer: Self-pay | Admitting: Nurse Practitioner

## 2016-04-20 VITALS — BP 111/77 | HR 79 | Temp 98.1°F | Ht 71.0 in | Wt 193.8 lb

## 2016-04-20 DIAGNOSIS — K7460 Unspecified cirrhosis of liver: Secondary | ICD-10-CM | POA: Diagnosis not present

## 2016-04-20 DIAGNOSIS — K209 Esophagitis, unspecified without bleeding: Secondary | ICD-10-CM

## 2016-04-20 DIAGNOSIS — Z8619 Personal history of other infectious and parasitic diseases: Secondary | ICD-10-CM | POA: Diagnosis not present

## 2016-04-20 LAB — COMPREHENSIVE METABOLIC PANEL
ALT: 20 U/L (ref 17–63)
AST: 27 U/L (ref 15–41)
Albumin: 3.9 g/dL (ref 3.5–5.0)
Alkaline Phosphatase: 71 U/L (ref 38–126)
Anion gap: 11 (ref 5–15)
BILIRUBIN TOTAL: 0.8 mg/dL (ref 0.3–1.2)
BUN: 13 mg/dL (ref 6–20)
CHLORIDE: 101 mmol/L (ref 101–111)
CO2: 22 mmol/L (ref 22–32)
CREATININE: 1.25 mg/dL — AB (ref 0.61–1.24)
Calcium: 9.4 mg/dL (ref 8.9–10.3)
Glucose, Bld: 85 mg/dL (ref 65–99)
POTASSIUM: 3.5 mmol/L (ref 3.5–5.1)
Sodium: 134 mmol/L — ABNORMAL LOW (ref 135–145)
TOTAL PROTEIN: 8.2 g/dL — AB (ref 6.5–8.1)

## 2016-04-20 LAB — CBC WITH DIFFERENTIAL/PLATELET
BASOS ABS: 0.1 10*3/uL (ref 0.0–0.1)
Basophils Relative: 1 %
EOS ABS: 0.1 10*3/uL (ref 0.0–0.7)
EOS PCT: 1 %
HCT: 52.1 % — ABNORMAL HIGH (ref 39.0–52.0)
HEMOGLOBIN: 17.6 g/dL — AB (ref 13.0–17.0)
LYMPHS ABS: 3.1 10*3/uL (ref 0.7–4.0)
Lymphocytes Relative: 33 %
MCH: 34.4 pg — AB (ref 26.0–34.0)
MCHC: 33.8 g/dL (ref 30.0–36.0)
MCV: 101.8 fL — ABNORMAL HIGH (ref 78.0–100.0)
Monocytes Absolute: 1.1 10*3/uL — ABNORMAL HIGH (ref 0.1–1.0)
Monocytes Relative: 11 %
NEUTROS PCT: 54 %
Neutro Abs: 4.9 10*3/uL (ref 1.7–7.7)
PLATELETS: 383 10*3/uL (ref 150–400)
RBC: 5.12 MIL/uL (ref 4.22–5.81)
RDW: 12.5 % (ref 11.5–15.5)
WBC: 9.4 10*3/uL (ref 4.0–10.5)

## 2016-04-20 LAB — IRON AND TIBC
IRON: 80 ug/dL (ref 45–182)
SATURATION RATIOS: 26 % (ref 17.9–39.5)
TIBC: 302 ug/dL (ref 250–450)
UIBC: 222 ug/dL

## 2016-04-20 LAB — PROTIME-INR
INR: 0.96
PROTHROMBIN TIME: 12.8 s (ref 11.4–15.2)

## 2016-04-20 LAB — FERRITIN: Ferritin: 74 ng/mL (ref 24–336)

## 2016-04-20 LAB — VITAMIN B12: VITAMIN B 12: 285 pg/mL (ref 180–914)

## 2016-04-20 LAB — FOLATE: FOLATE: 19.3 ng/mL (ref >5.9)

## 2016-04-20 MED ORDER — PANTOPRAZOLE SODIUM 40 MG PO TBEC: 40.0000 mg | DELAYED_RELEASE_TABLET | Freq: Two times a day (BID) | ORAL | 1 refills | Status: DC

## 2016-04-20 NOTE — Patient Instructions (Signed)
1. I have sent in a prescription for Protonix 40 mg to your pharmacy. Take this twice a day. 2. Avoid NSAID medications. These include ibuprofen, Motrin, Advil, Aleve, aspirin, and any medication with "NSAID" on the bottle. 3. We recommend that she stop drinking any alcohol. 4. We will help you schedule your ultrasound of your liver. 5. Return for follow-up in 6 months. 6. Call us if you have any new or worsening GI symptoms before then.

## 2016-04-20 NOTE — Assessment & Plan Note (Signed)
History of hepatitis C status post eradication in 2016 with Harvoni. Continue routine liver care as per below.
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
## 2016-04-20 NOTE — Assessment & Plan Note (Signed)
Hemachromatosis with a question of compliance with lab draws and therapeutic phlebotomy. He has no showed his last 2 phlebotomies and his last office visit follow-up. He did have his labs drawn today as well as therapeutic phlebotomy. Continue phlebotomy as needed and monitoring of ferritin. Liver care as per above.

## 2016-04-20 NOTE — Assessment & Plan Note (Signed)
LA grade D esophagitis on EGD completed 09/23/2015. Recommended stop famotidine and start Protonix twice a day. He has not done this because "of cost issues." He now has insurance. I discussed the rationale behind starting a PPI for healing affect. He is also recently been prescribed ibuprofen 600 mg. I recommend he avoid NSAIDs due to significant esophagitis to prevent erosions or ulcerations. He will follow up with his dentist, if needed, for pain control. I will resend Protonix prescription in to his pharmacy. Return for follow-up in 6 months or sooner if needed.

## 2016-04-20 NOTE — Progress Notes (Signed)
Antoine Poche presents today for phlebotomy per MD orders. HGB/HCT:16.4/47.6 Phlebotomy procedure started at 1349 and ended at 1355. 1 lb 3 oz removed. Patient tolerated procedure well. IV needle removed intact. Drsg to site. Ginger-ale and peanut-butter graham crackers given to pt. Tolerated well.

## 2016-04-20 NOTE — Assessment & Plan Note (Addendum)
History of cirrhosis likely multifactorial in nature. He has hemachromatosis, history of hepatitis C status post eradication. He continues to drink 6-12 drinks a week. I recommended he stop drinking. He states he knows he needs to do this. Recent endoscopy found no esophageal varices but moderate portal hypertensive gastropathy. He is on recall for repeat two-year endoscopy for variceal screening. His labs were drawn today and liver function overall looks good. C meld score calculation below. He is due for right upper quadrant ultrasound and we will order this today. Return for follow-up in 6 months for routine liver care.

## 2016-04-21 ENCOUNTER — Encounter: Payer: Self-pay | Admitting: Internal Medicine

## 2016-04-21 NOTE — Progress Notes (Signed)
CC'D TO PCP °

## 2016-04-21 NOTE — Patient Instructions (Signed)
NO PA is needed for the [place] the ref# [id number]

## 2016-04-21 NOTE — Progress Notes (Signed)
Results for ARNAV, CREGG (MRN [id number]) as of 04/21/2016 09:01  Ref. Range 04/20/2016 13:48  Sodium Latest Ref Range: 135 - 145 mmol/L 134 (L)  Potassium Latest Ref Range: 3.5 - 5.1 mmol/L 3.5  Chloride Latest Ref Range: 101 - 111 mmol/L 101  CO2 Latest Ref Range: 22 - 32 mmol/L 22  Glucose Latest Ref Range: 65 - 99 mg/dL 85  BUN Latest Ref Range: 6 - 20 mg/dL 13  Creatinine Latest Ref Range: 0.61 - 1.24 mg/dL 1.25 (H)  Calcium Latest Ref Range: 8.9 - 10.3 mg/dL 9.4  Anion gap Latest Ref Range: 5 - 15  11  Alkaline Phosphatase Latest Ref Range: 38 - 126 U/L 71  Albumin Latest Ref Range: 3.5 - 5.0 g/dL 3.9  AST Latest Ref Range: 15 - 41 U/L 27  ALT Latest Ref Range: 17 - 63 U/L 20  Total Protein Latest Ref Range: 6.5 - 8.1 g/dL 8.2 (H)  Total Bilirubin Latest Ref Range: 0.3 - 1.2 mg/dL 0.8  EGFR (African American) Latest Ref Range: >60 mL/min >60  EGFR (Non-African Amer.) Latest Ref Range: >60 mL/min >60  Iron Latest Ref Range: 45 - 182 ug/dL 80  UIBC Latest Units: ug/dL 222  TIBC Latest Ref Range: 250 - 450 ug/dL 302  Saturation Ratios Latest Ref Range: 17.9 - 39.5 % 26  Ferritin Latest Ref Range: 24 - 336 ng/mL 74  Folate Latest Ref Range: >5.9 ng/mL 19.3  Vitamin B12 Latest Ref Range: 180 - 914 pg/mL 285  WBC Latest Ref Range: 4.0 - 10.5 K/uL 9.4  RBC Latest Ref Range: 4.22 - 5.81 MIL/uL 5.12  Hemoglobin Latest Ref Range: 13.0 - 17.0 g/dL 17.6 (H)  HCT Latest Ref Range: 39.0 - 52.0 % 52.1 (H)  MCV Latest Ref Range: 78.0 - 100.0 fL 101.8 (H)  MCH Latest Ref Range: 26.0 - 34.0 pg 34.4 (H)  MCHC Latest Ref Range: 30.0 - 36.0 g/dL 33.8  RDW Latest Ref Range: 11.5 - 15.5 % 12.5  Platelets Latest Ref Range: 150 - 400 K/uL 383  Neutrophils Latest Units: % 54  Lymphocytes Latest Units: % 33  Monocytes Relative Latest Units: % 11  Eosinophil Latest Units: % 1  Basophil Latest Units: % 1  NEUT# Latest Ref Range: 1.7 - 7.7 K/uL 4.9  Lymphocyte # Latest Ref Range: 0.7 - 4.0 K/uL 3.1   Monocyte # Latest Ref Range: 0.1 - 1.0 K/uL 1.1 (H)  Eosinophils Absolute Latest Ref Range: 0.0 - 0.7 K/uL 0.1  Basophils Absolute Latest Ref Range: 0.0 - 0.1 K/uL 0.1  Prothrombin Time Latest Ref Range: 11.4 - 15.2 seconds 12.8  INR Unknown 0.96

## 2016-04-27 NOTE — Progress Notes (Signed)
MELD NA 9. Ferritin improving. Iron sat normal. B12/folate normal. He reported to Randall Hiss at [facility] that he drinks 6-12 beers per week. Based on MCV, I'm concerned he is drinking more.   He needs phlebotomy in four weeks if not already scheduled.  Repeat CBC, iron/tibc, ferritin prior to phlebotomy. CMET, CBC, PT/INR in 6 months for cirrhosis care.

## 2016-05-03 ENCOUNTER — Other Ambulatory Visit: Payer: Self-pay | Admitting: Gastroenterology

## 2016-05-03 ENCOUNTER — Ambulatory Visit (HOSPITAL_COMMUNITY): Admission: RE | Admit: 2016-05-03 | Payer: Medicare HMO | Source: Ambulatory Visit

## 2016-05-03 DIAGNOSIS — K7460 Unspecified cirrhosis of liver: Secondary | ICD-10-CM

## 2016-05-31 ENCOUNTER — Encounter (HOSPITAL_COMMUNITY)
Admission: RE | Admit: 2016-05-31 | Discharge: 2016-05-31 | Disposition: A | Discharge status: Home / Self Care | Payer: Medicare HMO | Source: Ambulatory Visit | Attending: Gastroenterology | Admitting: Gastroenterology

## 2016-05-31 ENCOUNTER — Encounter (HOSPITAL_COMMUNITY): Payer: Self-pay

## 2016-05-31 LAB — CBC WITH DIFFERENTIAL/PLATELET
BASOS ABS: 0.1 10*3/uL (ref 0.0–0.1)
BASOS PCT: 1 %
EOS ABS: 0.2 10*3/uL (ref 0.0–0.7)
Eosinophils Relative: 2 %
HCT: 48.9 % (ref 39.0–52.0)
Hemoglobin: 16.9 g/dL (ref 13.0–17.0)
LYMPHS ABS: 3.1 10*3/uL (ref 0.7–4.0)
Lymphocytes Relative: 33 %
MCH: 33.9 pg (ref 26.0–34.0)
MCHC: 34.6 g/dL (ref 30.0–36.0)
MCV: 98.2 fL (ref 78.0–100.0)
Monocytes Absolute: 1.1 10*3/uL — ABNORMAL HIGH (ref 0.1–1.0)
Monocytes Relative: 12 %
NEUTROS PCT: 52 %
Neutro Abs: 5 10*3/uL (ref 1.7–7.7)
PLATELETS: 521 10*3/uL — AB (ref 150–400)
RBC: 4.98 MIL/uL (ref 4.22–5.81)
RDW: 12.9 % (ref 11.5–15.5)
WBC: 9.5 10*3/uL (ref 4.0–10.5)

## 2016-05-31 NOTE — Progress Notes (Signed)
Antoine Poche presents today for phlebotomy per MD orders. Lab work drawn per orders.  Phlebotomy procedure started at 1305 and ended at 1312 500 cc removed. Patient tolerated procedure well. IV needle removed intact.  No complaints voiced with phlebotomy.  Graham crackers, peanut butter, and gingerale given for a snack.  Patient warm and dry.  No s/s of distress noted.    Pressure dressing applied to right arm with no bruising or swelling noted at site.

## 2016-05-31 NOTE — Progress Notes (Signed)
Patient discharged in stable condition.  Vital signs stable.  Right arm clean, dry, and intact with no bruising or swelling noted at site.

## 2016-06-01 LAB — FERRITIN: FERRITIN: 69 ng/mL (ref 24–336)

## 2016-06-01 LAB — IRON AND TIBC
IRON: 50 ug/dL (ref 45–182)
Saturation Ratios: 18 % (ref 17.9–39.5)
TIBC: 281 ug/dL (ref 250–450)
UIBC: 231 ug/dL

## 2016-06-01 NOTE — Progress Notes (Signed)
Results for DENNARD, VEZINA (MRN [id number]) as of 06/01/2016 10:16  Ref. Range 05/31/2016 13:14  Iron Latest Ref Range: 45 - 182 ug/dL 50  UIBC Latest Units: ug/dL 231  TIBC Latest Ref Range: 250 - 450 ug/dL 281  Saturation Ratios Latest Ref Range: 17.9 - 39.5 % 18  Ferritin Latest Ref Range: 24 - 336 ng/mL 69  WBC Latest Ref Range: 4.0 - 10.5 K/uL 9.5  RBC Latest Ref Range: 4.22 - 5.81 MIL/uL 4.98  Hemoglobin Latest Ref Range: 13.0 - 17.0 g/dL 16.9  HCT Latest Ref Range: 39.0 - 52.0 % 48.9  MCV Latest Ref Range: 78.0 - 100.0 fL 98.2  MCH Latest Ref Range: 26.0 - 34.0 pg 33.9  MCHC Latest Ref Range: 30.0 - 36.0 g/dL 34.6  RDW Latest Ref Range: 11.5 - 15.5 % 12.9  Platelets Latest Ref Range: 150 - 400 K/uL 521 (H)  Neutrophils Latest Units: % 52  Lymphocytes Latest Units: % 33  Monocytes Relative Latest Units: % 12  Eosinophil Latest Units: % 2  Basophil Latest Units: % 1  NEUT# Latest Ref Range: 1.7 - 7.7 K/uL 5.0  Lymphocyte # Latest Ref Range: 0.7 - 4.0 K/uL 3.1  Monocyte # Latest Ref Range: 0.1 - 1.0 K/uL 1.1 (H)  Eosinophils Absolute Latest Ref Range: 0.0 - 0.7 K/uL 0.2  Basophils Absolute Latest Ref Range: 0.0 - 0.1 K/uL 0.1

## 2016-06-08 NOTE — Progress Notes (Signed)
Recommend recheck CBC, iron/tibc, ferritin 07/2016.

## 2016-06-09 ENCOUNTER — Other Ambulatory Visit: Payer: Self-pay | Admitting: Gastroenterology

## 2016-06-27 ENCOUNTER — Other Ambulatory Visit: Payer: Self-pay

## 2016-08-04 ENCOUNTER — Other Ambulatory Visit (HOSPITAL_COMMUNITY)
Admission: RE | Admit: 2016-08-04 | Discharge: 2016-08-04 | Disposition: A | Discharge status: Home / Self Care | Payer: Medicare HMO | Source: Ambulatory Visit | Attending: Gastroenterology | Admitting: Gastroenterology

## 2016-08-04 LAB — CBC WITH DIFFERENTIAL/PLATELET
BASOS ABS: 0.1 10*3/uL (ref 0.0–0.1)
BASOS PCT: 1 %
Eosinophils Absolute: 0.2 10*3/uL (ref 0.0–0.7)
Eosinophils Relative: 2 %
HEMATOCRIT: 46.8 % (ref 39.0–52.0)
HEMOGLOBIN: 15.9 g/dL (ref 13.0–17.0)
LYMPHS PCT: 38 %
Lymphs Abs: 3.7 10*3/uL (ref 0.7–4.0)
MCH: 31.8 pg (ref 26.0–34.0)
MCHC: 34 g/dL (ref 30.0–36.0)
MCV: 93.6 fL (ref 78.0–100.0)
MONOS PCT: 10 %
Monocytes Absolute: 0.9 10*3/uL (ref 0.1–1.0)
NEUTROS ABS: 4.8 10*3/uL (ref 1.7–7.7)
NEUTROS PCT: 49 %
Platelets: 450 10*3/uL — ABNORMAL HIGH (ref 150–400)
RBC: 5 MIL/uL (ref 4.22–5.81)
RDW: 14.2 % (ref 11.5–15.5)
WBC: 9.7 10*3/uL (ref 4.0–10.5)

## 2016-08-04 LAB — IRON AND TIBC
Iron: 60 ug/dL (ref 45–182)
SATURATION RATIOS: 21 % (ref 17.9–39.5)
TIBC: 287 ug/dL (ref 250–450)
UIBC: 227 ug/dL

## 2016-08-04 LAB — FERRITIN: FERRITIN: 61 ng/mL (ref 24–336)

## 2016-08-15 NOTE — Progress Notes (Signed)
His labs are stable, no need for phlebotomy at this time.  Recheck iron, ferritin, cbc ALONG with CMET, PT/INR that is planned in 10/2016.

## 2016-08-17 ENCOUNTER — Other Ambulatory Visit: Payer: Self-pay | Admitting: Gastroenterology

## 2016-09-06 ENCOUNTER — Other Ambulatory Visit: Payer: Self-pay

## 2016-09-06 DIAGNOSIS — K7460 Unspecified cirrhosis of liver: Secondary | ICD-10-CM

## 2016-10-24 ENCOUNTER — Telehealth: Payer: Self-pay | Admitting: Nurse Practitioner

## 2016-10-24 ENCOUNTER — Ambulatory Visit: Payer: Medicare HMO | Admitting: Nurse Practitioner

## 2016-10-24 ENCOUNTER — Encounter: Payer: Self-pay | Admitting: Nurse Practitioner

## 2016-10-24 NOTE — Telephone Encounter (Signed)
PATIENT WAS A NO SHOW AND LETTER SENT  °

## 2016-10-24 NOTE — Telephone Encounter (Signed)
Noted  

## 2016-11-16 ENCOUNTER — Other Ambulatory Visit (HOSPITAL_COMMUNITY)
Admission: RE | Admit: 2016-11-16 | Discharge: 2016-11-16 | Disposition: A | Discharge status: Home / Self Care | Payer: Medicare HMO | Source: Ambulatory Visit | Attending: Gastroenterology | Admitting: Gastroenterology

## 2016-11-16 DIAGNOSIS — K7460 Unspecified cirrhosis of liver: Secondary | ICD-10-CM | POA: Insufficient documentation

## 2016-11-16 LAB — CBC WITH DIFFERENTIAL/PLATELET
BASOS ABS: 0.1 10*3/uL (ref 0.0–0.1)
Basophils Relative: 1 %
EOS PCT: 1 %
Eosinophils Absolute: 0.1 10*3/uL (ref 0.0–0.7)
HCT: 47.4 % (ref 39.0–52.0)
HEMOGLOBIN: 16 g/dL (ref 13.0–17.0)
LYMPHS PCT: 30 %
Lymphs Abs: 3.3 10*3/uL (ref 0.7–4.0)
MCH: 31.7 pg (ref 26.0–34.0)
MCHC: 33.8 g/dL (ref 30.0–36.0)
MCV: 93.9 fL (ref 78.0–100.0)
Monocytes Absolute: 1.4 10*3/uL — ABNORMAL HIGH (ref 0.1–1.0)
Monocytes Relative: 13 %
NEUTROS PCT: 55 %
Neutro Abs: 6 10*3/uL (ref 1.7–7.7)
PLATELETS: 384 10*3/uL (ref 150–400)
RBC: 5.05 MIL/uL (ref 4.22–5.81)
RDW: 15.7 % — ABNORMAL HIGH (ref 11.5–15.5)
WBC: 10.9 10*3/uL — AB (ref 4.0–10.5)

## 2016-11-16 LAB — COMPREHENSIVE METABOLIC PANEL
ALT: 22 U/L (ref 17–63)
ANION GAP: 14 (ref 5–15)
AST: 28 U/L (ref 15–41)
Albumin: 3.8 g/dL (ref 3.5–5.0)
Alkaline Phosphatase: 78 U/L (ref 38–126)
BILIRUBIN TOTAL: 0.8 mg/dL (ref 0.3–1.2)
BUN: 19 mg/dL (ref 6–20)
CO2: 22 mmol/L (ref 22–32)
Calcium: 9.2 mg/dL (ref 8.9–10.3)
Chloride: 102 mmol/L (ref 101–111)
Creatinine, Ser: 1.19 mg/dL (ref 0.61–1.24)
Glucose, Bld: 120 mg/dL — ABNORMAL HIGH (ref 65–99)
POTASSIUM: 3.6 mmol/L (ref 3.5–5.1)
Sodium: 138 mmol/L (ref 135–145)
TOTAL PROTEIN: 7.9 g/dL (ref 6.5–8.1)

## 2016-11-16 LAB — IRON AND TIBC
IRON: 34 ug/dL — AB (ref 45–182)
Saturation Ratios: 12 % — ABNORMAL LOW (ref 17.9–39.5)
TIBC: 286 ug/dL (ref 250–450)
UIBC: 252 ug/dL

## 2016-11-16 LAB — PROTIME-INR
INR: 0.97
PROTHROMBIN TIME: 12.8 s (ref 11.4–15.2)

## 2016-11-16 LAB — FERRITIN: Ferritin: 37 ng/mL (ref 24–336)

## 2016-11-28 NOTE — Progress Notes (Signed)
Labs are stable. No need for phlebotomy. Repeat CBC, iron/tibc, ferritin in 3 months.  Recommend OV with RMR only.

## 2016-12-05 ENCOUNTER — Other Ambulatory Visit: Payer: Self-pay

## 2016-12-05 DIAGNOSIS — K219 Gastro-esophageal reflux disease without esophagitis: Secondary | ICD-10-CM

## 2016-12-07 ENCOUNTER — Telehealth: Payer: Self-pay

## 2016-12-07 NOTE — Telephone Encounter (Signed)
Noted  

## 2016-12-07 NOTE — Telephone Encounter (Signed)
Pt was returning a call regarding his results. He said he was out of minutes on his phone and would call back.

## 2016-12-08 NOTE — Telephone Encounter (Signed)
Pt notified of results, routing call for 3 month apt with Dr. Gala Romney only

## 2016-12-20 ENCOUNTER — Telehealth: Payer: Self-pay

## 2016-12-20 ENCOUNTER — Ambulatory Visit (INDEPENDENT_AMBULATORY_CARE_PROVIDER_SITE_OTHER): Payer: Medicare HMO | Admitting: Internal Medicine

## 2016-12-20 ENCOUNTER — Encounter: Payer: Self-pay | Admitting: *Deleted

## 2016-12-20 ENCOUNTER — Other Ambulatory Visit: Payer: Self-pay

## 2016-12-20 ENCOUNTER — Encounter: Payer: Self-pay | Admitting: Internal Medicine

## 2016-12-20 DIAGNOSIS — K209 Esophagitis, unspecified without bleeding: Secondary | ICD-10-CM

## 2016-12-20 DIAGNOSIS — Z8619 Personal history of other infectious and parasitic diseases: Secondary | ICD-10-CM

## 2016-12-20 DIAGNOSIS — K7460 Unspecified cirrhosis of liver: Secondary | ICD-10-CM

## 2016-12-20 MED ORDER — PANTOPRAZOLE SODIUM 40 MG PO TBEC: 40.0000 mg | DELAYED_RELEASE_TABLET | Freq: Every day | ORAL | 3 refills | Status: DC
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
## 2016-12-20 NOTE — Telephone Encounter (Signed)
Got it. Yes. Hepatitis B surface antibody and total hepatitis A anti-body. Thanks.

## 2016-12-20 NOTE — Telephone Encounter (Signed)
Noted, orders done and released, tried calling pt, not able to get through. Will call pt back.

## 2016-12-20 NOTE — Patient Instructions (Addendum)
Keep appointment for iron studies 1/19  Plan for EGD and colonoscopy in January 2020  Continue Protonix 40 mg daily (disp #90 - one daily with 3 refills)  Liver ultrasound every 6 months  GERD information  Office visit in 9 mos.

## 2016-12-20 NOTE — Telephone Encounter (Signed)
No labs have been done for Hep A or Hep B. Do you want pt to have labwork?

## 2016-12-22 NOTE — Telephone Encounter (Signed)
Tried calling pt, unable to leave a voice message.

## 2016-12-23 ENCOUNTER — Ambulatory Visit (HOSPITAL_COMMUNITY)
Admission: RE | Admit: 2016-12-23 | Discharge: 2016-12-23 | Disposition: A | Discharge status: Home / Self Care | Payer: Medicare HMO | Source: Ambulatory Visit | Attending: Nurse Practitioner | Admitting: Nurse Practitioner

## 2016-12-23 DIAGNOSIS — R932 Abnormal findings on diagnostic imaging of liver and biliary tract: Secondary | ICD-10-CM | POA: Insufficient documentation

## 2016-12-23 DIAGNOSIS — K209 Esophagitis, unspecified without bleeding: Secondary | ICD-10-CM

## 2016-12-23 DIAGNOSIS — Z8619 Personal history of other infectious and parasitic diseases: Secondary | ICD-10-CM | POA: Insufficient documentation

## 2016-12-23 DIAGNOSIS — K7460 Unspecified cirrhosis of liver: Secondary | ICD-10-CM

## 2016-12-26 NOTE — Telephone Encounter (Signed)
Left a VM on pts daughters phone asking for a return call.

## 2017-01-05 NOTE — Telephone Encounter (Signed)
Mailed letter to pt due to not returning our call.

## 2017-01-12 ENCOUNTER — Other Ambulatory Visit: Payer: Self-pay

## 2017-01-12 DIAGNOSIS — K219 Gastro-esophageal reflux disease without esophagitis: Secondary | ICD-10-CM

## 2017-01-13 ENCOUNTER — Other Ambulatory Visit (HOSPITAL_COMMUNITY)
Admission: RE | Admit: 2017-01-13 | Discharge: 2017-01-13 | Disposition: A | Discharge status: Home / Self Care | Payer: Medicare HMO | Source: Ambulatory Visit | Attending: Internal Medicine | Admitting: Internal Medicine

## 2017-01-13 DIAGNOSIS — K7460 Unspecified cirrhosis of liver: Secondary | ICD-10-CM | POA: Insufficient documentation

## 2017-01-14 LAB — HEPATITIS A ANTIBODY, IGM: Hep A IgM: NEGATIVE

## 2017-01-14 LAB — HEPATITIS B SURFACE ANTIBODY,QUALITATIVE: Hep B S Ab: NONREACTIVE

## 2017-01-19 ENCOUNTER — Encounter: Payer: Self-pay | Admitting: Internal Medicine

## 2017-01-19 LAB — HEPATITIS A ANTIBODY, TOTAL: HEP A TOTAL AB: NEGATIVE

## 2017-08-31 IMAGING — US US ABDOMEN LIMITED
1 series · 14 of 25 positions shown · non-contrast
Comparison: Abdominal ultrasound April 22, 2014

CLINICAL DATA: History of hepatic cirrhosis, hemochromatosis,
hepatitis-C. Patient completed Harvoni treatment in Tuesday April, 2014

EXAM:
US ABDOMEN LIMITED - RIGHT UPPER QUADRANT

[Series 1: us abdomen limited · 0.24mm/px · 14 of 52 slices shown]
[im 1/52]
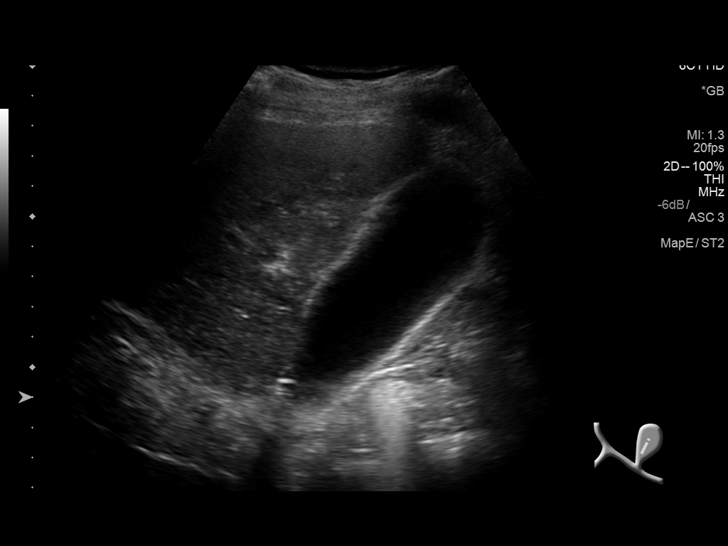
[im 5/52]
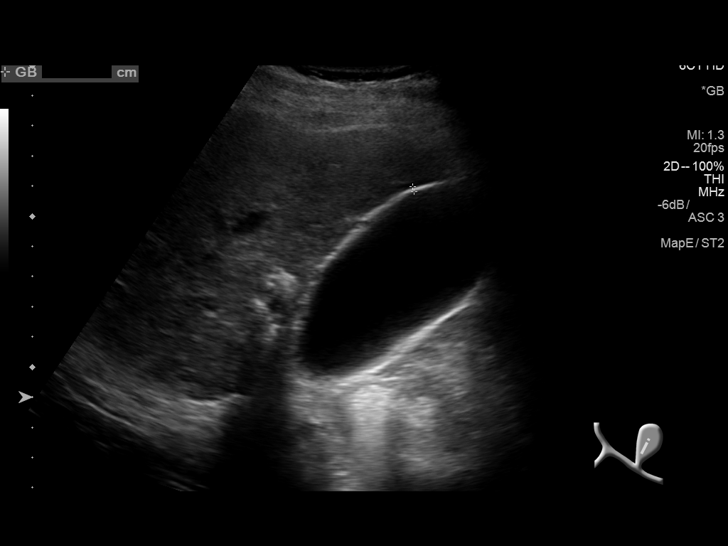
[im 9/52]
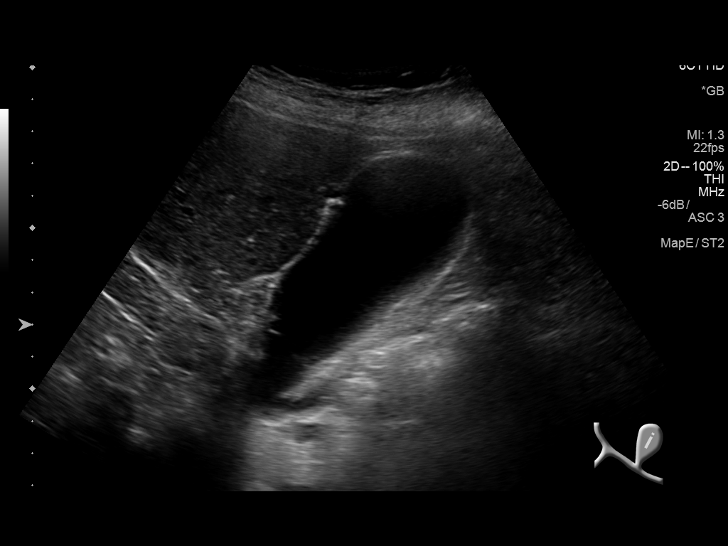
[im 13/52]
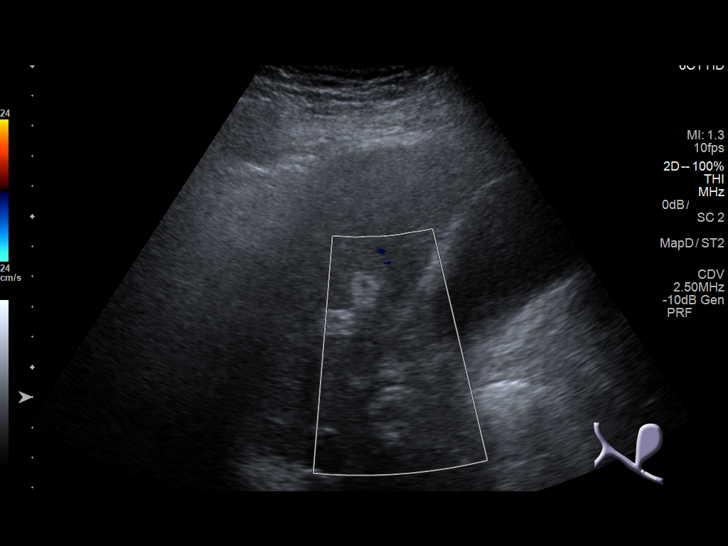
[im 18/52]
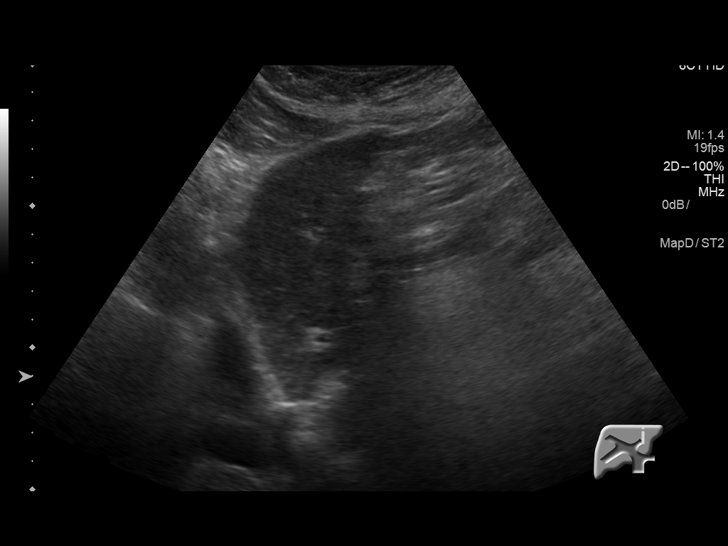
[im 20/52]
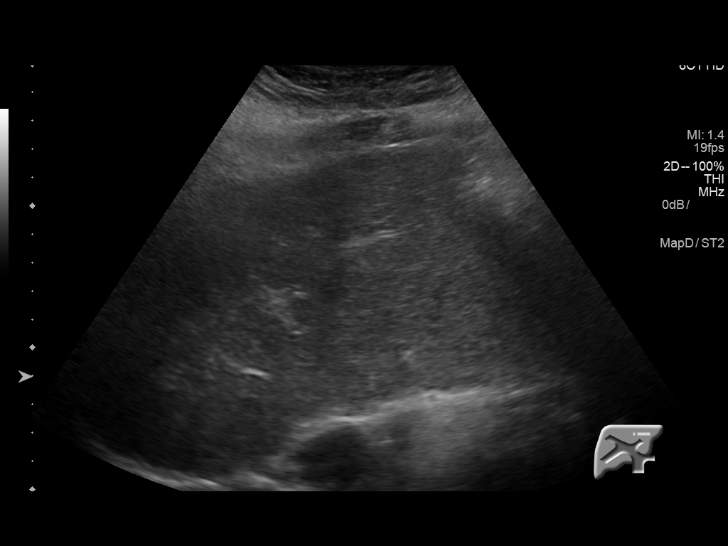
[im 24/52]
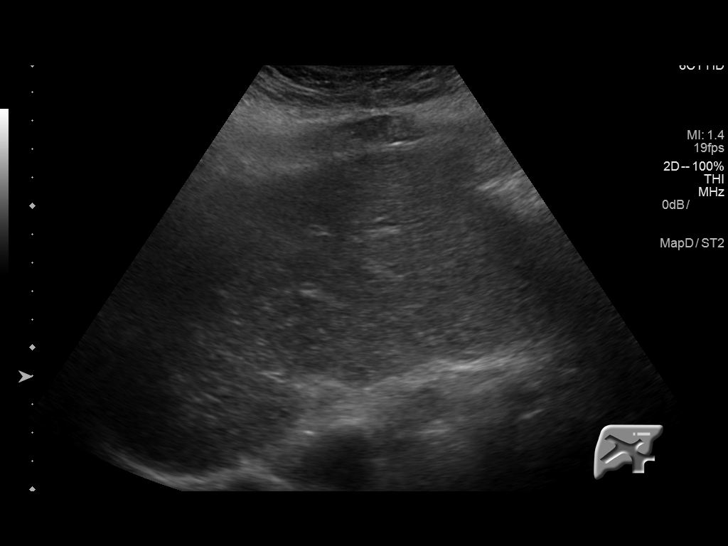
[im 28/52]
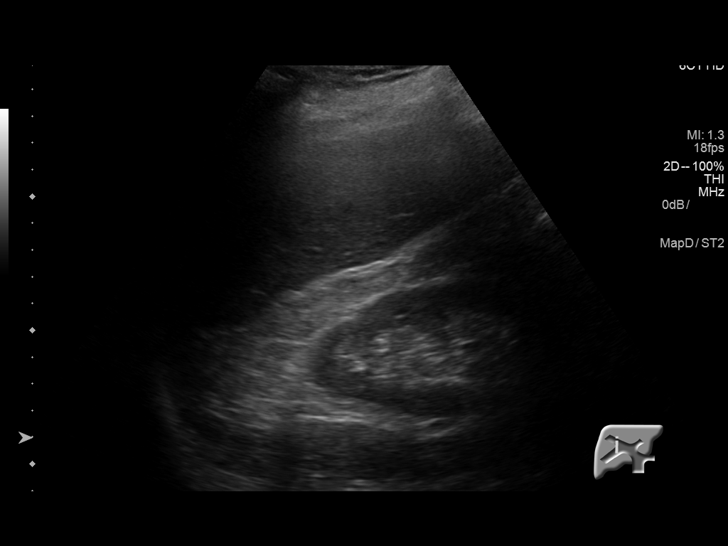
[im 32/52]
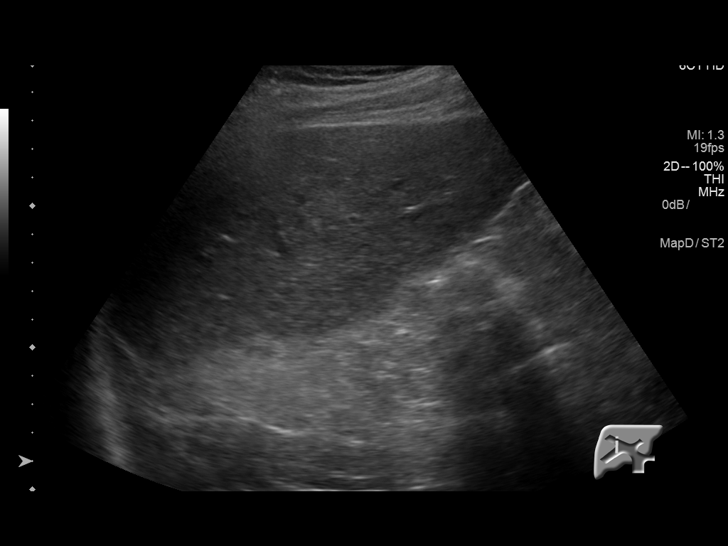
[im 35/52]
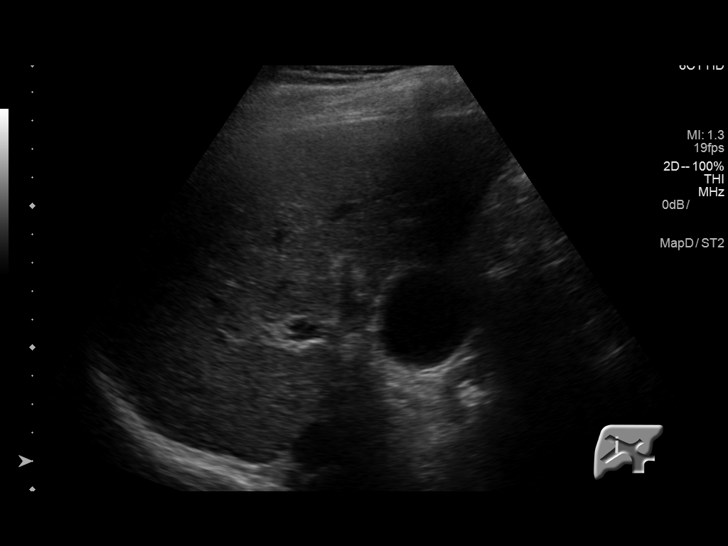
[im 39/52]
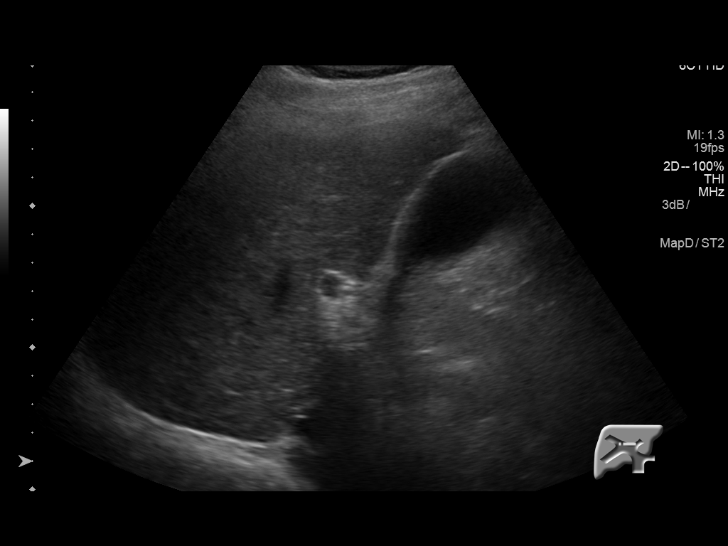
[im 43/52]
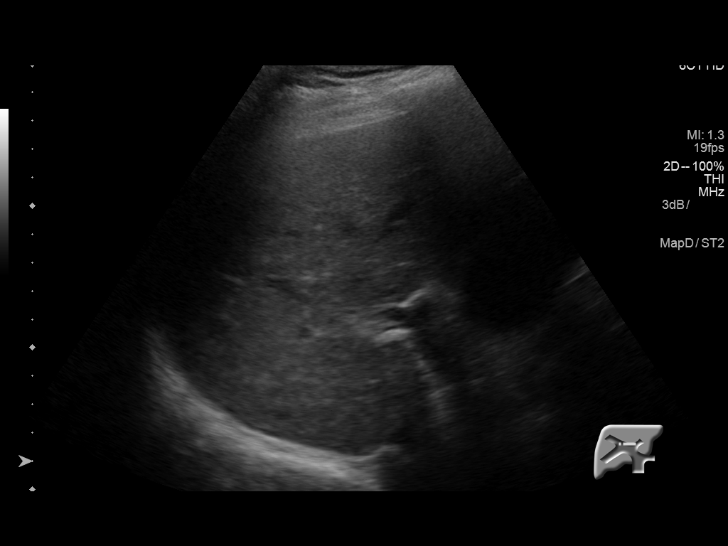
[im 47/52]
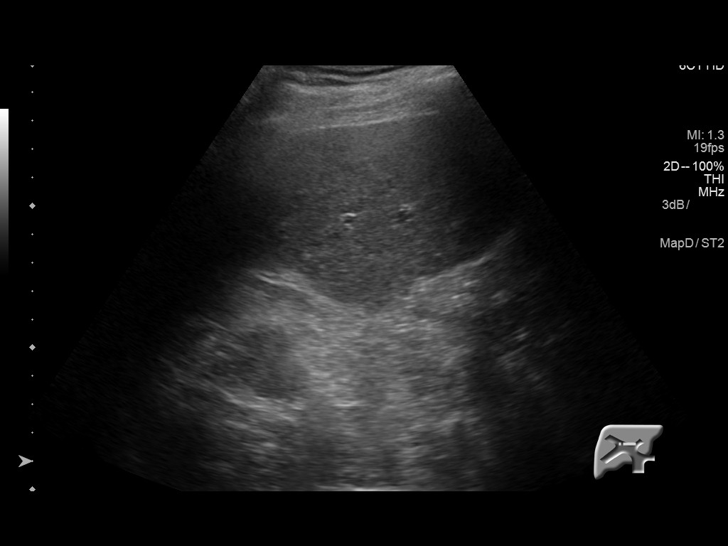
[im 52/52]
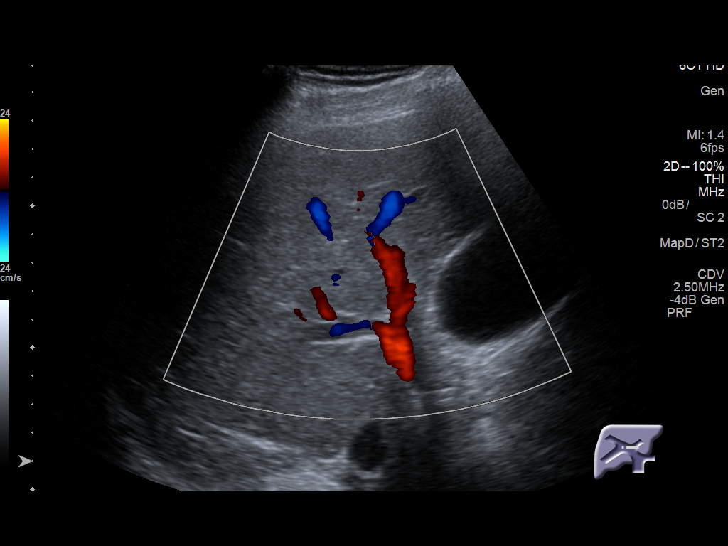

[14 of 25 positions shown; findings below may reference images not displayed]

FINDINGS: Gallbladder:

No gallstones or wall thickening visualized. No sonographic Murphy
sign noted by sonographer.

Common bile duct:

Diameter: 3.3 mm

Liver:

The hepatic echotexture is heterogeneously increased. There is no
discrete mass. The surface contour of the liver remains smooth.
There is no intrahepatic ductal dilation.
IMPRESSION: Heterogeneous hepatic echotexture consistent with known
hepatocellular disease. There is no discrete mass. The gallbladder
and common bile duct are normal.

## 2017-09-06 ENCOUNTER — Telehealth: Payer: Self-pay | Admitting: Internal Medicine

## 2017-09-06 DIAGNOSIS — K7460 Unspecified cirrhosis of liver: Secondary | ICD-10-CM

## 2017-09-06 NOTE — Telephone Encounter (Signed)
[phone] please call patient, he thinks it is time for his repeat ultrasound and labs

## 2017-09-07 NOTE — Telephone Encounter (Signed)
Labs and [place] are past due. He is also due for OV. Called pt, he requested lab orders be faxed to lab. OV scheduled for 11/15/17. New address updated in Epic.  [place] abd RUQ scheduled for 09/14/17 at 9:30am, arrive at 9:15am. NPO after midnight prior to test. Tried to call pt back to inform of [place] appt, no answer, no VM set-up. Letter mailed. Lab orders faxed.

## 2017-09-07 NOTE — Telephone Encounter (Signed)
Tried to call pt to inform of [place] appt, no answer. He then called office back and was informed of appt.

## 2017-09-14 ENCOUNTER — Ambulatory Visit (HOSPITAL_COMMUNITY): Admission: RE | Admit: 2017-09-14 | Payer: Medicare HMO | Source: Ambulatory Visit

## 2017-09-15 ENCOUNTER — Telehealth: Payer: Self-pay

## 2017-09-15 NOTE — Telephone Encounter (Signed)
Noted  

## 2017-09-15 NOTE — Telephone Encounter (Signed)
Pt called to say he went to [facility] to do labs yesterday and they did not have any orders. He said this keeps happening to him. I told him that you have to request [place] to fax the labs there and he said he has done so. He would like for his lab orders to be mailed to him and he is going to do in [place]. He said that will be much easier on him.  Also, he said he just received the letter for his [place] and has to call and reschedule that. He is aware he can call them and reschedule and he will do so.   Forwarding to [place] for labs and FYI to [facility] about the [place].

## 2017-09-18 NOTE — Telephone Encounter (Signed)
Tried calling pt. Number isn't available. No lab orders are in system for pt. Letter was mailed 01/2017 about injections. Will try to call pt back.

## 2017-09-20 NOTE — Telephone Encounter (Signed)
Tried calling pt. Phone isn't available.

## 2017-09-26 NOTE — Telephone Encounter (Signed)
PT called to say his u/s is tomorrow 09/27/17 at [facility] and he needs lab orders faxed. Pt said the lab didn't have his orders. Per MB notes, lab orders were faxed. Will discuss with MB on 09/27/17.

## 2017-09-27 ENCOUNTER — Ambulatory Visit (HOSPITAL_COMMUNITY)
Admission: RE | Admit: 2017-09-27 | Discharge: 2017-09-27 | Disposition: A | Discharge status: Home / Self Care | Payer: Medicare HMO | Source: Ambulatory Visit | Attending: Internal Medicine | Admitting: Internal Medicine

## 2017-09-27 ENCOUNTER — Other Ambulatory Visit (HOSPITAL_COMMUNITY)
Admission: RE | Admit: 2017-09-27 | Discharge: 2017-09-27 | Disposition: A | Discharge status: Home / Self Care | Payer: Medicare HMO | Source: Ambulatory Visit | Attending: Gastroenterology | Admitting: Gastroenterology

## 2017-09-27 DIAGNOSIS — R932 Abnormal findings on diagnostic imaging of liver and biliary tract: Secondary | ICD-10-CM | POA: Insufficient documentation

## 2017-09-27 DIAGNOSIS — K219 Gastro-esophageal reflux disease without esophagitis: Secondary | ICD-10-CM | POA: Diagnosis present

## 2017-09-27 DIAGNOSIS — K7689 Other specified diseases of liver: Secondary | ICD-10-CM | POA: Diagnosis not present

## 2017-09-27 DIAGNOSIS — K7460 Unspecified cirrhosis of liver: Secondary | ICD-10-CM | POA: Diagnosis not present

## 2017-09-27 LAB — CBC WITH DIFFERENTIAL/PLATELET
Basophils Absolute: 0.1 10*3/uL (ref 0.0–0.1)
Basophils Relative: 1 %
EOS PCT: 2 %
Eosinophils Absolute: 0.2 10*3/uL (ref 0.0–0.7)
HEMATOCRIT: 46.5 % (ref 39.0–52.0)
Hemoglobin: 16 g/dL (ref 13.0–17.0)
LYMPHS ABS: 3.6 10*3/uL (ref 0.7–4.0)
LYMPHS PCT: 38 %
MCH: 32.7 pg (ref 26.0–34.0)
MCHC: 34.4 g/dL (ref 30.0–36.0)
MCV: 94.9 fL (ref 78.0–100.0)
Monocytes Absolute: 1.3 10*3/uL — ABNORMAL HIGH (ref 0.1–1.0)
Monocytes Relative: 13 %
Neutro Abs: 4.2 10*3/uL (ref 1.7–7.7)
Neutrophils Relative %: 46 %
PLATELETS: 352 10*3/uL (ref 150–400)
RBC: 4.9 MIL/uL (ref 4.22–5.81)
RDW: 14.2 % (ref 11.5–15.5)
WBC: 9.4 10*3/uL (ref 4.0–10.5)

## 2017-09-27 LAB — IRON AND TIBC
Iron: 198 ug/dL — ABNORMAL HIGH (ref 45–182)
SATURATION RATIOS: 61 % — AB (ref 17.9–39.5)
TIBC: 326 ug/dL (ref 250–450)
UIBC: 128 ug/dL

## 2017-09-27 LAB — FERRITIN: Ferritin: 36 ng/mL (ref 24–336)

## 2017-09-27 NOTE — Telephone Encounter (Signed)
Lab orders from 01/2017 faxed to [facility] lab.

## 2017-09-27 NOTE — Telephone Encounter (Signed)
Called and informed pt lab orders were faxed to lab.

## 2017-09-27 NOTE — Telephone Encounter (Signed)
Pt returned call. documentation is in anther phone note.

## 2017-11-06 ENCOUNTER — Telehealth: Payer: Self-pay | Admitting: Internal Medicine

## 2017-11-06 NOTE — Telephone Encounter (Signed)
PATIENT CALLED FOR ULTRASOUND RESULTS

## 2017-11-06 NOTE — Telephone Encounter (Signed)
Tried calling pt back and wasn't able to get through. Not able to leave a VM. Will call back.

## 2017-11-09 NOTE — Telephone Encounter (Signed)
Pt is inquiring about his U/S results. Will mail letter to pt.

## 2017-11-09 NOTE — Telephone Encounter (Signed)
Lmom(pts daughters phone). Will mail results.

## 2017-11-09 NOTE — Telephone Encounter (Signed)
Kyle [name] please nic 6 month repeat abd u/s

## 2017-11-09 NOTE — Telephone Encounter (Signed)
This result appears not to have been sent to me.  Cirrhotic liver with cyst in left lobe (this is OK).  Recommend repeat liver U/S every 6 months

## 2017-11-09 NOTE — Telephone Encounter (Signed)
Tried reaching pt again. Phone wasn't available.

## 2017-11-10 NOTE — Telephone Encounter (Signed)
Reminder in epic °

## 2017-11-15 ENCOUNTER — Encounter: Payer: Self-pay | Admitting: Nurse Practitioner

## 2017-11-15 ENCOUNTER — Ambulatory Visit: Payer: Medicare HMO | Admitting: Nurse Practitioner

## 2017-11-15 ENCOUNTER — Other Ambulatory Visit: Payer: Self-pay | Admitting: *Deleted

## 2017-11-15 ENCOUNTER — Encounter: Payer: Self-pay | Admitting: *Deleted

## 2017-11-15 ENCOUNTER — Telehealth: Payer: Self-pay | Admitting: *Deleted

## 2017-11-15 VITALS — BP 135/95 | HR 73 | Temp 98.1°F | Ht 71.0 in | Wt 230.0 lb

## 2017-11-15 DIAGNOSIS — K219 Gastro-esophageal reflux disease without esophagitis: Secondary | ICD-10-CM

## 2017-11-15 DIAGNOSIS — K7460 Unspecified cirrhosis of liver: Secondary | ICD-10-CM

## 2017-11-15 DIAGNOSIS — F1010 Alcohol abuse, uncomplicated: Secondary | ICD-10-CM | POA: Diagnosis not present

## 2017-11-15 DIAGNOSIS — Z86010 Personal history of colonic polyps: Secondary | ICD-10-CM | POA: Diagnosis not present

## 2017-11-15 MED ORDER — PEG 3350-KCL-NA BICARB-NACL 420 G PO SOLR: 4000.0000 mL | Freq: Once | ORAL | 0 refills | Status: AC

## 2017-11-15 NOTE — Assessment & Plan Note (Signed)
Unfortunately he is started drinking again.  He drinks about twice a week, 4 drinks per sitting.  This is in the setting of chronic hepatitis C and alcoholic cirrhosis, mixed etiology.  We will again recommend he refrain from alcohol.  Follow-up in 6 months.
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
## 2017-11-15 NOTE — Assessment & Plan Note (Signed)
The patient has a history of cirrhosis.  He needs updating of his labs including CMP, INR, AFP in order to calculate meld score and check for suggestions of hepatoma.  His right upper quadrant ultrasound did not show any overt lesions.  He did have a single benign-appearing cyst.  We will monitor this.  He is also currently due for repeat colonoscopy and endoscopy and we will schedule this at this time.  Unfortunately, he started drinking again.  He states he drinks about twice a week with 4 drinks per sitting.  He does have some right upper quadrant soreness and noted mild hepatomegaly.  Recommend he abstain from all alcohol.  Follow-up in 6 months.  Proceed with EGD on propofol/MAC with Dr. Gala Romney in near future: the risks, benefits, and alternatives have been discussed with the patient in detail. The patient states understanding and desires to proceed.  The patient is not on any anticoagulants, anxiolytics, chronic pain medications, or antidepressants.  History of chronic alcohol abuse.  He is drinking again.  History of drug use, not currently using.  Given this we will plan for the procedure on propofol/MAC to promote adequate sedation.

## 2017-11-15 NOTE — Patient Instructions (Signed)
1. Have your labs drawn when you are able to. 2. We will schedule your procedures for you. 3. As we always recommend, we advise you to stop drinking any alcohol given your liver disease. 4. We can help with alcohol cessation if you would like our assistance. 5. Return for follow-up in 6 months. 6. Call us if you have any questions or concerns.  At [facility] we value your feedback. You may receive a survey about your visit today. Please share your experience as we strive to create trusting relationships with our patients to provide genuine, compassionate, quality care.  We appreciate your understanding and patience as we review any laboratory studies, imaging, and other diagnostic tests that are ordered as we care for you. Our office policy is 5 business days for review of these results, and any emergent or urgent results are addressed in a timely manner for your best interest. If you do not hear from our office in 1 week, please contact us.   We also encourage the use of MyChart, which contains your medical information for your review as well. If you are not enrolled in this feature, an access code is on this after visit summary for your convenience. Thank you for allowing [place] to be involved in your care.  It was great to meet you today!  I hope you have a great Fall!!

## 2017-11-15 NOTE — Progress Notes (Signed)
CC'D TO PCP °

## 2017-11-15 NOTE — Telephone Encounter (Signed)
Patient provided me # to call in office of [phone]. Pre-op scheduled for 12-21-17 at 9:00am. Patient aware. Letter mailed also.

## 2017-11-15 NOTE — Assessment & Plan Note (Signed)
History of adenomatous colon polyps.  Recommended repeat colonoscopy in January 2020.  We will go ahead and get him scheduled today.  We will plan an EGD at the same time, as per above.  No red flag/warning signs or symptoms.  Proceed with TCS on propofol/MAC with Dr. Gala Romney in near future: the risks, benefits, and alternatives have been discussed with the patient in detail. The patient states understanding and desires to proceed.  The patient is not on any anticoagulants, anxiolytics, chronic pain medications, or antidepressants.  History of chronic alcohol abuse.  He is drinking again.  History of drug use, not currently using.  Given this we will plan for the procedure on propofol/MAC to promote adequate sedation.

## 2017-11-15 NOTE — Assessment & Plan Note (Signed)
GERD currently well managed on PPI.  Recommend he continue his current medications and follow-up in 6 months. 

## 2017-12-15 NOTE — Patient Instructions (Signed)
Kyle [name]  12/15/2017     @PREFPERIOPPHARMACY @   Your procedure is scheduled on  12/28/2017 .  Report to [facility] at  815  A.M.  Call this number if you have problems the morning of surgery:  [phone]   Remember:  Follow the diet and prep instructions given to you by Dr Roseanne Kaufman office.                         Take these medicines the morning of surgery with A SIP OF WATER  Atenolol, lisinopril, protonix.    Do not wear jewelry, make-up or nail polish.  Do not wear lotions, powders, or perfumes, or deodorant.  Do not shave 48 hours prior to surgery.  Men may shave face and neck.  Do not bring valuables to the hospital.  [facility] is not responsible for any belongings or valuables.  Contacts, dentures or bridgework may not be worn into surgery.  Leave your suitcase in the car.  After surgery it may be brought to your room.  For patients admitted to the hospital, discharge time will be determined by your treatment team.  Patients discharged the day of surgery will not be allowed to drive home.   Name and phone number of your driver:   Family Special instructions:  None  Please read over the following fact sheets that you were given. Anesthesia Post-op Instructions and Care and Recovery After Surgery       Esophagogastroduodenoscopy Esophagogastroduodenoscopy (EGD) is a procedure to examine the lining of the esophagus, stomach, and first part of the small intestine (duodenum). This procedure is done to check for problems such as inflammation, bleeding, ulcers, or growths. During this procedure, a long, flexible, lighted tube with a camera attached (endoscope) is inserted down the throat. Tell a health care provider about:  Any allergies you have.  All medicines you are taking, including vitamins, herbs, eye drops, creams, and over-the-counter medicines.  Any problems you or family members have had with anesthetic medicines.  Any  blood disorders you have.  Any surgeries you have had.  Any medical conditions you have.  Whether you are pregnant or may be pregnant. What are the risks? Generally, this is a safe procedure. However, problems may occur, including:  Infection.  Bleeding.  A tear (perforation) in the esophagus, stomach, or duodenum.  Trouble breathing.  Excessive sweating.  Spasms of the larynx.  A slowed heartbeat.  Low blood pressure.  What happens before the procedure?  Follow instructions from your health care provider about eating or drinking restrictions.  Ask your health care provider about: ? Changing or stopping your regular medicines. This is especially important if you are taking diabetes medicines or blood thinners. ? Taking medicines such as aspirin and ibuprofen. These medicines can thin your blood. Do not take these medicines before your procedure if your health care provider instructs you not to.  Plan to have someone take you home after the procedure.  If you wear dentures, be ready to remove them before the procedure. What happens during the procedure?  To reduce your risk of infection, your health care team will wash or sanitize their hands.  An IV tube will be put in a vein in your hand or arm. You will get medicines and fluids through this tube.  You will be given one or more of the following: ?  A medicine to help you relax (sedative). ? A medicine to numb the area (local anesthetic). This medicine may be sprayed into your throat. It will make you feel more comfortable and keep you from gagging or coughing during the procedure. ? A medicine for pain.  A mouth guard may be placed in your mouth to protect your teeth and to keep you from biting on the endoscope.  You will be asked to lie on your left side.  The endoscope will be lowered down your throat into your esophagus, stomach, and duodenum.  Air will be put into the endoscope. This will help your health  care provider see better.  The lining of your esophagus, stomach, and duodenum will be examined.  Your health care provider may: ? Take a tissue sample so it can be looked at in a lab (biopsy). ? Remove growths. ? Remove objects (foreign bodies) that are stuck. ? Treat any bleeding with medicines or other devices that stop tissue from bleeding. ? Widen (dilate) or stretch narrowed areas of your esophagus and stomach.  The endoscope will be taken out. The procedure may vary among health care providers and hospitals. What happens after the procedure?  Your blood pressure, heart rate, breathing rate, and blood oxygen level will be monitored often until the medicines you were given have worn off.  Do not eat or drink anything until the numbing medicine has worn off and your gag reflex has returned. This information is not intended to replace advice given to you by your health care provider. Make sure you discuss any questions you have with your health care provider. Document Released: 05/27/2004 Document Revised: 07/02/2015 Document Reviewed: 12/18/2014 Elsevier Interactive Patient Education  2018 [employer]. Esophagogastroduodenoscopy, Care After Refer to this sheet in the next few weeks. These instructions provide you with information about caring for yourself after your procedure. Your health care provider may also give you more specific instructions. Your treatment has been planned according to current medical practices, but problems sometimes occur. Call your health care provider if you have any problems or questions after your procedure. What can I expect after the procedure? After the procedure, it is common to have:  A sore throat.  Nausea.  Bloating.  Dizziness.  Fatigue.  Follow these instructions at home:  Do not eat or drink anything until the numbing medicine (local anesthetic) has worn off and your gag reflex has returned. You will know that the local anesthetic has  worn off when you can swallow comfortably.  Do not drive for 24 hours if you received a medicine to help you relax (sedative).  If your health care provider took a tissue sample for testing during the procedure, make sure to get your test results. This is your responsibility. Ask your health care provider or the department performing the test when your results will be ready.  Keep all follow-up visits as told by your health care provider. This is important. Contact a health care provider if:  You cannot stop coughing.  You are not urinating.  You are urinating less than usual. Get help right away if:  You have trouble swallowing.  You cannot eat or drink.  You have throat or chest pain that gets worse.  You are dizzy or light-headed.  You faint.  You have nausea or vomiting.  You have chills.  You have a fever.  You have severe abdominal pain.  You have black, tarry, or bloody stools. This information is not intended to  replace advice given to you by your health care provider. Make sure you discuss any questions you have with your health care provider. Document Released: 01/11/2012 Document Revised: 07/02/2015 Document Reviewed: 12/18/2014 Elsevier Interactive Patient Education  2018 [employer].  Colonoscopy, Adult A colonoscopy is an exam to look at the large intestine. It is done to check for problems, such as:  Lumps (tumors).  Growths (polyps).  Swelling (inflammation).  Bleeding.  What happens before the procedure? Eating and drinking Follow instructions from your doctor about eating and drinking. These instructions may include:  A few days before the procedure - follow a low-fiber diet. ? Avoid nuts. ? Avoid seeds. ? Avoid dried fruit. ? Avoid raw fruits. ? Avoid vegetables.  1-3 days before the procedure - follow a clear liquid diet. Avoid liquids that have red or purple dye. Drink only clear liquids, such as: ? Clear broth or bouillon. ? Black  coffee or tea. ? Clear juice. ? Clear soft drinks or sports drinks. ? Gelatin dessert. ? Popsicles.  On the day of the procedure - do not eat or drink anything during the 2 hours before the procedure.  Bowel prep If you were prescribed an oral bowel prep:  Take it as told by your doctor. Starting the day before your procedure, you will need to drink a lot of liquid. The liquid will cause you to poop (have bowel movements) until your poop is almost clear or light green.  If your skin or butt gets irritated from diarrhea, you may: ? Wipe the area with wipes that have medicine in them, such as adult wet wipes with aloe and vitamin E. ? Put something on your skin that soothes the area, such as petroleum jelly.  If you throw up (vomit) while drinking the bowel prep, take a break for up to 60 minutes. Then begin the bowel prep again. If you keep throwing up and you cannot take the bowel prep without throwing up, call your doctor.  General instructions  Ask your doctor about changing or stopping your normal medicines. This is important if you take diabetes medicines or blood thinners.  Plan to have someone take you home from the hospital or clinic. What happens during the procedure?  An IV tube may be put into one of your veins.  You will be given medicine to help you relax (sedative).  To reduce your risk of infection: ? Your doctors will wash their hands. ? Your anal area will be washed with soap.  You will be asked to lie on your side with your knees bent.  Your doctor will get a long, thin, flexible tube ready. The tube will have a camera and a light on the end.  The tube will be put into your anus.  The tube will be gently put into your large intestine.  Air will be delivered into your large intestine to keep it open. You may feel some pressure or cramping.  The camera will be used to take photos.  A small tissue sample may be removed from your body to be looked at under a  microscope (biopsy). If any possible problems are found, the tissue will be sent to a lab for testing.  If small growths are found, your doctor may remove them and have them checked for cancer.  The tube that was put into your anus will be slowly removed. The procedure may vary among doctors and hospitals. What happens after the procedure?  Your doctor will check  on you often until the medicines you were given have worn off.  Do not drive for 24 hours after the procedure.  You may have a small amount of blood in your poop.  You may pass gas.  You may have mild cramps or bloating in your belly (abdomen).  It is up to you to get the results of your procedure. Ask your doctor, or the department performing the procedure, when your results will be ready. This information is not intended to replace advice given to you by your health care provider. Make sure you discuss any questions you have with your health care provider. Document Released: 02/26/2010 Document Revised: 11/25/2015 Document Reviewed: 04/07/2015 Elsevier Interactive Patient Education  2017 [employer]  Colonoscopy, Adult, Care After This sheet gives you information about how to care for yourself after your procedure. Your health care provider may also give you more specific instructions. If you have problems or questions, contact your health care provider. What can I expect after the procedure? After the procedure, it is common to have:  A small amount of blood in your stool for 24 hours after the procedure.  Some gas.  Mild abdominal cramping or bloating.  Follow these instructions at home: General instructions   For the first 24 hours after the procedure: ? Do not drive or use machinery. ? Do not sign important documents. ? Do not drink alcohol. ? Do your regular daily activities at a slower pace than normal. ? Eat soft, easy-to-digest foods. ? Rest often.  Take over-the-counter or prescription medicines  only as told by your health care provider.  It is up to you to get the results of your procedure. Ask your health care provider, or the department performing the procedure, when your results will be ready. Relieving cramping and bloating  Try walking around when you have cramps or feel bloated.  Apply heat to your abdomen as told by your health care provider. Use a heat source that your health care provider recommends, such as a moist heat pack or a heating pad. ? Place a towel between your skin and the heat source. ? Leave the heat on for 20-30 minutes. ? Remove the heat if your skin turns bright red. This is especially important if you are unable to feel pain, heat, or cold. You may have a greater risk of getting burned. Eating and drinking  Drink enough fluid to keep your urine clear or pale yellow.  Resume your normal diet as instructed by your health care provider. Avoid heavy or fried foods that are hard to digest.  Avoid drinking alcohol for as long as instructed by your health care provider. Contact a health care provider if:  You have blood in your stool 2-3 days after the procedure. Get help right away if:  You have more than a small spotting of blood in your stool.  You pass large blood clots in your stool.  Your abdomen is swollen.  You have nausea or vomiting.  You have a fever.  You have increasing abdominal pain that is not relieved with medicine. This information is not intended to replace advice given to you by your health care provider. Make sure you discuss any questions you have with your health care provider. Document Released: 09/08/2003 Document Revised: 10/19/2015 Document Reviewed: 04/07/2015 Elsevier Interactive Patient Education  2018 [place] Anesthesia is a term that refers to techniques, procedures, and medicines that help a person stay safe and comfortable during  a medical procedure. Monitored anesthesia care, or  sedation, is one type of anesthesia. Your anesthesia specialist may recommend sedation if you will be having a procedure that does not require you to be unconscious, such as:  Cataract surgery.  A dental procedure.  A biopsy.  A colonoscopy.  During the procedure, you may receive a medicine to help you relax (sedative). There are three levels of sedation:  Mild sedation. At this level, you may feel awake and relaxed. You will be able to follow directions.  Moderate sedation. At this level, you will be sleepy. You may not remember the procedure.  Deep sedation. At this level, you will be asleep. You will not remember the procedure.  The more medicine you are given, the deeper your level of sedation will be. Depending on how you respond to the procedure, the anesthesia specialist may change your level of sedation or the type of anesthesia to fit your needs. An anesthesia specialist will monitor you closely during the procedure. Let your health care provider know about:  Any allergies you have.  All medicines you are taking, including vitamins, herbs, eye drops, creams, and over-the-counter medicines.  Any use of steroids (by mouth or as a cream).  Any problems you or family members have had with sedatives and anesthetic medicines.  Any blood disorders you have.  Any surgeries you have had.  Any medical conditions you have, such as sleep apnea.  Whether you are pregnant or may be pregnant.  Any use of cigarettes, alcohol, or street drugs. What are the risks? Generally, this is a safe procedure. However, problems may occur, including:  Getting too much medicine (oversedation).  Nausea.  Allergic reaction to medicines.  Trouble breathing. If this happens, a breathing tube may be used to help with breathing. It will be removed when you are awake and breathing on your own.  Heart trouble.  Lung trouble.  Before the procedure Staying hydrated Follow instructions from  your health care provider about hydration, which may include:  Up to 2 hours before the procedure - you may continue to drink clear liquids, such as water, clear fruit juice, black coffee, and plain tea.  Eating and drinking restrictions Follow instructions from your health care provider about eating and drinking, which may include:  8 hours before the procedure - stop eating heavy meals or foods such as meat, fried foods, or fatty foods.  6 hours before the procedure - stop eating light meals or foods, such as toast or cereal.  6 hours before the procedure - stop drinking milk or drinks that contain milk.  2 hours before the procedure - stop drinking clear liquids.  Medicines Ask your health care provider about:  Changing or stopping your regular medicines. This is especially important if you are taking diabetes medicines or blood thinners.  Taking medicines such as aspirin and ibuprofen. These medicines can thin your blood. Do not take these medicines before your procedure if your health care provider instructs you not to.  Tests and exams  You will have a physical exam.  You may have blood tests done to show: ? How well your kidneys and liver are working. ? How well your blood can clot.  General instructions  Plan to have someone take you home from the hospital or clinic.  If you will be going home right after the procedure, plan to have someone with you for 24 hours.  What happens during the procedure?  Your blood pressure, heart rate,  breathing, level of pain and overall condition will be monitored.  An IV tube will be inserted into one of your veins.  Your anesthesia specialist will give you medicines as needed to keep you comfortable during the procedure. This may mean changing the level of sedation.  The procedure will be performed. After the procedure  Your blood pressure, heart rate, breathing rate, and blood oxygen level will be monitored until the medicines  you were given have worn off.  Do not drive for 24 hours if you received a sedative.  You may: ? Feel sleepy, clumsy, or nauseous. ? Feel forgetful about what happened after the procedure. ? Have a sore throat if you had a breathing tube during the procedure. ? Vomit. This information is not intended to replace advice given to you by your health care provider. Make sure you discuss any questions you have with your health care provider. Document Released: 10/20/2004 Document Revised: 07/03/2015 Document Reviewed: 05/17/2015 Elsevier Interactive Patient Education  2018 [place], Care After These instructions provide you with information about caring for yourself after your procedure. Your health care provider may also give you more specific instructions. Your treatment has been planned according to current medical practices, but problems sometimes occur. Call your health care provider if you have any problems or questions after your procedure. What can I expect after the procedure? After your procedure, it is common to:  Feel sleepy for several hours.  Feel clumsy and have poor balance for several hours.  Feel forgetful about what happened after the procedure.  Have poor judgment for several hours.  Feel nauseous or vomit.  Have a sore throat if you had a breathing tube during the procedure.  Follow these instructions at home: For at least 24 hours after the procedure:   Do not: ? Participate in activities in which you could fall or become injured. ? Drive. ? Use heavy machinery. ? Drink alcohol. ? Take sleeping pills or medicines that cause drowsiness. ? Make important decisions or sign legal documents. ? Take care of children on your own.  Rest. Eating and drinking  Follow the diet that is recommended by your health care provider.  If you vomit, drink water, juice, or soup when you can drink without vomiting.  Make sure you have little  or no nausea before eating solid foods. General instructions  Have a responsible adult stay with you until you are awake and alert.  Take over-the-counter and prescription medicines only as told by your health care provider.  If you smoke, do not smoke without supervision.  Keep all follow-up visits as told by your health care provider. This is important. Contact a health care provider if:  You keep feeling nauseous or you keep vomiting.  You feel light-headed.  You develop a rash.  You have a fever. Get help right away if:  You have trouble breathing. This information is not intended to replace advice given to you by your health care provider. Make sure you discuss any questions you have with your health care provider. Document Released: 05/17/2015 Document Revised: 09/16/2015 Document Reviewed: 05/17/2015 Elsevier Interactive Patient Education  [employer].

## 2017-12-21 ENCOUNTER — Encounter (HOSPITAL_COMMUNITY): Payer: Self-pay

## 2017-12-21 ENCOUNTER — Encounter (HOSPITAL_COMMUNITY)
Admission: RE | Admit: 2017-12-21 | Discharge: 2017-12-21 | Disposition: A | Discharge status: Home / Self Care | Payer: Medicare HMO | Source: Ambulatory Visit | Attending: Internal Medicine | Admitting: Internal Medicine

## 2017-12-21 ENCOUNTER — Other Ambulatory Visit: Payer: Self-pay

## 2017-12-21 DIAGNOSIS — R9431 Abnormal electrocardiogram [ECG] [EKG]: Secondary | ICD-10-CM | POA: Insufficient documentation

## 2017-12-21 DIAGNOSIS — Z01818 Encounter for other preprocedural examination: Secondary | ICD-10-CM | POA: Diagnosis present

## 2017-12-21 HISTORY — DX: Gastro-esophageal reflux disease without esophagitis: K21.9

## 2017-12-21 HISTORY — DX: Unspecified osteoarthritis, unspecified site: M19.90

## 2017-12-21 LAB — PROTIME-INR
INR: 1
Prothrombin Time: 13.1 seconds (ref 11.4–15.2)

## 2017-12-21 LAB — COMPREHENSIVE METABOLIC PANEL
ALBUMIN: 3.3 g/dL — AB (ref 3.5–5.0)
ALK PHOS: 78 U/L (ref 38–126)
ALT: 19 U/L (ref 0–44)
AST: 27 U/L (ref 15–41)
Anion gap: 10 (ref 5–15)
BILIRUBIN TOTAL: 1.3 mg/dL — AB (ref 0.3–1.2)
BUN: 12 mg/dL (ref 8–23)
CALCIUM: 8.9 mg/dL (ref 8.9–10.3)
CO2: 22 mmol/L (ref 22–32)
CREATININE: 1.08 mg/dL (ref 0.61–1.24)
Chloride: 102 mmol/L (ref 98–111)
GFR calc non Af Amer: >60 mL/min (ref >60)
GLUCOSE: 143 mg/dL — AB (ref 70–99)
Potassium: 3.9 mmol/L (ref 3.5–5.1)
Sodium: 134 mmol/L — ABNORMAL LOW (ref 135–145)
TOTAL PROTEIN: 7.9 g/dL (ref 6.5–8.1)

## 2017-12-21 NOTE — Progress Notes (Signed)
   12/21/17 0926  OBSTRUCTIVE SLEEP APNEA  Have you ever been diagnosed with sleep apnea through a sleep study? No  Do you snore loudly (loud enough to be heard through closed doors)?  1  Do you often feel tired, fatigued, or sleepy during the daytime (such as falling asleep during driving or talking to someone)? 0  Has anyone observed you stop breathing during your sleep? 0  Do you have, or are you being treated for high blood pressure? 1  BMI more than 35 kg/m2? 0  Age > 50 (1-yes) 1  Neck circumference greater than:Male 16 inches or larger, Male 17inches or larger? 0  Male Gender (Yes=1) 1  Obstructive Sleep Apnea Score 4  Score 5 or greater  Results sent to PCP

## 2017-12-22 LAB — AFP TUMOR MARKER: AFP, Serum, Tumor Marker: 5.3 ng/mL (ref 0.0–8.3)

## 2017-12-26 LAB — URINE DRUGS OF ABUSE SCREEN W ALC, ROUTINE (REF LAB)
Amphetamines, Urine: NEGATIVE ng/mL
BENZODIAZEPINE QUANT UR: NEGATIVE ng/mL
Barbiturate, Ur: NEGATIVE ng/mL
CANNABINOID QUANT UR: NEGATIVE ng/mL
Methadone Screen, Urine: NEGATIVE ng/mL
OPIATE QUANT UR: NEGATIVE ng/mL
PHENCYCLIDINE, UR: NEGATIVE ng/mL
Propoxyphene, Urine: NEGATIVE ng/mL

## 2017-12-26 LAB — ETHANOL CONFIRM, URINE: Ethanol, Ur - Confirmation: 0.024 %

## 2017-12-26 LAB — COCAINE CONF, UR
Benzoylecgonine GC/MS Conf: 26880 ng/mL
COCAINE METAB QUANT UR: POSITIVE — AB

## 2017-12-28 ENCOUNTER — Encounter (HOSPITAL_COMMUNITY): Payer: Self-pay | Admitting: Anesthesiology

## 2017-12-28 ENCOUNTER — Ambulatory Visit (HOSPITAL_COMMUNITY): Payer: Medicare HMO | Admitting: Anesthesiology

## 2017-12-28 ENCOUNTER — Other Ambulatory Visit: Payer: Self-pay

## 2017-12-28 ENCOUNTER — Ambulatory Visit (HOSPITAL_COMMUNITY)
Admission: RE | Admit: 2017-12-28 | Discharge: 2017-12-28 | Disposition: A | Discharge status: Home / Self Care | Payer: Medicare HMO | Source: Ambulatory Visit | Attending: Internal Medicine | Admitting: Internal Medicine

## 2017-12-28 ENCOUNTER — Encounter (HOSPITAL_COMMUNITY)
Admission: RE | Disposition: A | Discharge status: Home / Self Care | Payer: Self-pay | Source: Ambulatory Visit | Attending: Internal Medicine

## 2017-12-28 DIAGNOSIS — K7460 Unspecified cirrhosis of liver: Secondary | ICD-10-CM | POA: Insufficient documentation

## 2017-12-28 DIAGNOSIS — Z832 Family history of diseases of the blood and blood-forming organs and certain disorders involving the immune mechanism: Secondary | ICD-10-CM | POA: Diagnosis not present

## 2017-12-28 DIAGNOSIS — Z539 Procedure and treatment not carried out, unspecified reason: Secondary | ICD-10-CM | POA: Diagnosis not present

## 2017-12-28 DIAGNOSIS — Z86010 Personal history of colonic polyps: Secondary | ICD-10-CM | POA: Diagnosis not present

## 2017-12-28 DIAGNOSIS — I10 Essential (primary) hypertension: Secondary | ICD-10-CM | POA: Insufficient documentation

## 2017-12-28 DIAGNOSIS — K449 Diaphragmatic hernia without obstruction or gangrene: Secondary | ICD-10-CM | POA: Insufficient documentation

## 2017-12-28 DIAGNOSIS — Z9081 Acquired absence of spleen: Secondary | ICD-10-CM | POA: Diagnosis not present

## 2017-12-28 DIAGNOSIS — Z79899 Other long term (current) drug therapy: Secondary | ICD-10-CM | POA: Insufficient documentation

## 2017-12-28 DIAGNOSIS — K219 Gastro-esophageal reflux disease without esophagitis: Secondary | ICD-10-CM

## 2017-12-28 LAB — RAPID URINE DRUG SCREEN, HOSP PERFORMED
Amphetamines: NOT DETECTED
Barbiturates: NOT DETECTED
Benzodiazepines: NOT DETECTED
Cocaine: POSITIVE — AB
Opiates: NOT DETECTED
Tetrahydrocannabinol: NOT DETECTED

## 2017-12-28 SURGERY — COLONOSCOPY WITH PROPOFOL
Anesthesia: Monitor Anesthesia Care

## 2017-12-28 MED ORDER — CHLORHEXIDINE GLUCONATE CLOTH 2 % EX PADS: 6.0000 | MEDICATED_PAD | Freq: Once | CUTANEOUS | Status: DC

## 2017-12-28 NOTE — Anesthesia Preprocedure Evaluation (Signed)

## 2017-12-28 NOTE — Progress Notes (Addendum)
Patient arrived to unit for colonoscopy and EGD. Urine drug screen positive for cocaine. Discussed with Dr. Gala Romney and Dr. Hilaria Ota. Procedure cancelled. Patient understood and will call office to reschedule. Blood pressure elevated. Discussed with Dr. Hilaria Ota. Patient stated he had been to his physicians office yesterday and they increased his BP medication due to an elevated blood pressure. Patient encouraged to monitor BP and follow up with physician if it continues to be elevated.

## 2018-02-12 ENCOUNTER — Telehealth: Payer: Self-pay

## 2018-02-12 DIAGNOSIS — K209 Esophagitis, unspecified without bleeding: Secondary | ICD-10-CM

## 2018-02-12 DIAGNOSIS — Z8619 Personal history of other infectious and parasitic diseases: Secondary | ICD-10-CM

## 2018-02-12 DIAGNOSIS — K7460 Unspecified cirrhosis of liver: Secondary | ICD-10-CM

## 2018-02-12 NOTE — Telephone Encounter (Signed)
Pt would like a refill of Protonix sent to [facility]

## 2018-02-15 MED ORDER — PANTOPRAZOLE SODIUM 40 MG PO TBEC: 40.0000 mg | DELAYED_RELEASE_TABLET | Freq: Every day | ORAL | 3 refills | Status: DC

## 2018-02-15 NOTE — Telephone Encounter (Signed)
Rx sent per request. 

## 2018-02-15 NOTE — Addendum Note (Signed)
Addended by: Gordy Levan, ERIC A on: 02/15/2018 03:35 PM   Modules accepted: Orders

## 2018-02-16 NOTE — Telephone Encounter (Signed)
Pt notified that Rx was sent to the pharmacy.

## 2018-04-26 ENCOUNTER — Telehealth: Payer: Self-pay | Admitting: Internal Medicine

## 2018-04-26 NOTE — Telephone Encounter (Signed)
RECALL FOR ULTRASOUND 

## 2018-04-26 NOTE — Telephone Encounter (Signed)
Letter mailed

## 2018-05-10 ENCOUNTER — Telehealth: Payer: Self-pay

## 2018-05-10 DIAGNOSIS — K7460 Unspecified cirrhosis of liver: Secondary | ICD-10-CM

## 2018-05-10 DIAGNOSIS — K209 Esophagitis, unspecified without bleeding: Secondary | ICD-10-CM

## 2018-05-10 DIAGNOSIS — Z8619 Personal history of other infectious and parasitic diseases: Secondary | ICD-10-CM

## 2018-05-10 NOTE — Telephone Encounter (Signed)
[place] abd RUQ scheduled for 06/08/18 at 10:30am, arrive at 10:15am. NPO after midnight before test. Pt informed. Letter mailed.

## 2018-05-10 NOTE — Telephone Encounter (Signed)
[facility] Refill-Pt called to change his retail pharmacy. His pharmacy has changed to [facility]. Pt would like a refill of Pantoprazole 40 mg one capsule daily.   [facility] Clinical-Pt also received a letter in the mail about scheduling his u/s. Pt said he would like to have his u/s after covid-19 calms down.

## 2018-05-10 NOTE — Addendum Note (Signed)
Addended by: Hassan Rowan on: 05/10/2018 04:08 PM   Modules accepted: Orders

## 2018-05-11 MED ORDER — PANTOPRAZOLE SODIUM 40 MG PO TBEC: 40.0000 mg | DELAYED_RELEASE_TABLET | Freq: Every day | ORAL | 3 refills | Status: AC

## 2018-05-11 NOTE — Addendum Note (Signed)
Addended by: Gordy Levan, Lamorris Knoblock A on: 05/11/2018 10:39 AM   Modules accepted: Orders

## 2018-05-11 NOTE — Telephone Encounter (Signed)
Refill sent.

## 2018-05-14 NOTE — Telephone Encounter (Signed)
Noted  

## 2018-05-17 ENCOUNTER — Ambulatory Visit: Payer: Medicare HMO | Admitting: Nurse Practitioner

## 2018-06-08 ENCOUNTER — Other Ambulatory Visit: Payer: Self-pay

## 2018-06-08 ENCOUNTER — Ambulatory Visit (HOSPITAL_COMMUNITY)
Admission: RE | Admit: 2018-06-08 | Discharge: 2018-06-08 | Disposition: A | Discharge status: Home / Self Care | Payer: Medicare HMO | Source: Ambulatory Visit | Attending: Internal Medicine | Admitting: Internal Medicine

## 2018-06-08 DIAGNOSIS — K7460 Unspecified cirrhosis of liver: Secondary | ICD-10-CM | POA: Insufficient documentation

## 2018-06-08 DIAGNOSIS — Z8619 Personal history of other infectious and parasitic diseases: Secondary | ICD-10-CM | POA: Insufficient documentation

## 2018-08-09 ENCOUNTER — Ambulatory Visit: Payer: Medicare HMO | Admitting: Nurse Practitioner

## 2018-08-09 ENCOUNTER — Telehealth: Payer: Self-pay | Admitting: Internal Medicine

## 2018-08-09 NOTE — Telephone Encounter (Signed)
PATIENT WAS A NO SHOW X4  °

## 2018-08-09 NOTE — Telephone Encounter (Signed)
Routing to Dr. Gala Romney for approval to discharge from practice

## 2018-08-12 NOTE — Telephone Encounter (Signed)
That is discouraging.  We no longer have a productive doctor-patient relationship.  Discharge.

## 2018-08-13 ENCOUNTER — Encounter: Payer: Self-pay | Admitting: General Practice

## 2018-08-13 NOTE — Telephone Encounter (Signed)
Discharge letter mailed  

## 2018-09-26 ENCOUNTER — Telehealth: Payer: Self-pay

## 2018-09-26 NOTE — Telephone Encounter (Signed)
Pt left a VM on 09/25/2018 @ 2:32 pm when I was out of the office. Pt would like a copy of his records. He will be seeing a doctor this Thursday 09/27/2018 in [place]. Pt can be reached at [phone]

## 2019-09-08 DEATH — deceased

## 2019-10-09 DEATH — deceased

## 2019-12-09 IMAGING — US ULTRASOUND ABDOMEN LIMITED
1 series · 14 of 25 positions shown · non-contrast
Comparison: Prior abdominal ultrasound 09/27/2017

CLINICAL DATA: 62-year-old male with cirrhosis

EXAM:
ULTRASOUND ABDOMEN LIMITED RIGHT UPPER QUADRANT

[Series 1: ultrasound abdomen limited · 14 of 58 slices shown]
[im 1/58]
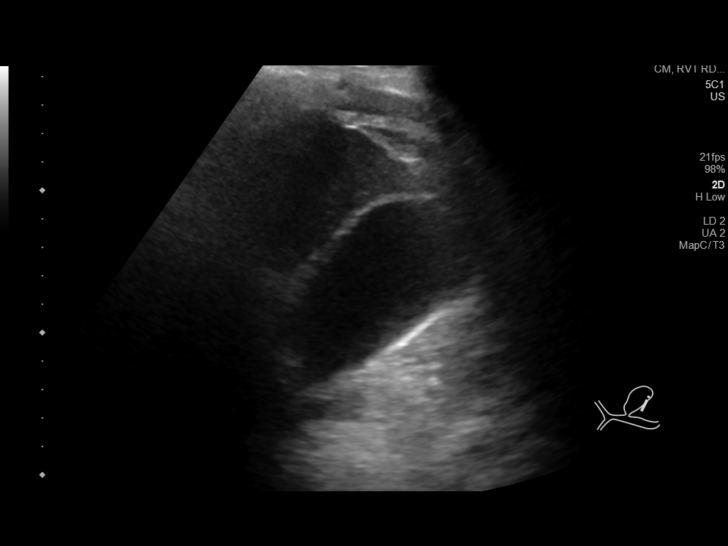
[im 5/58]
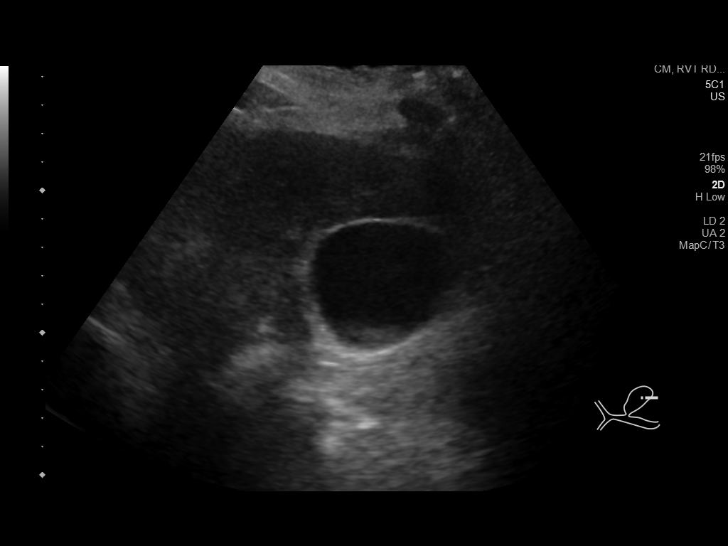
[im 10/58]
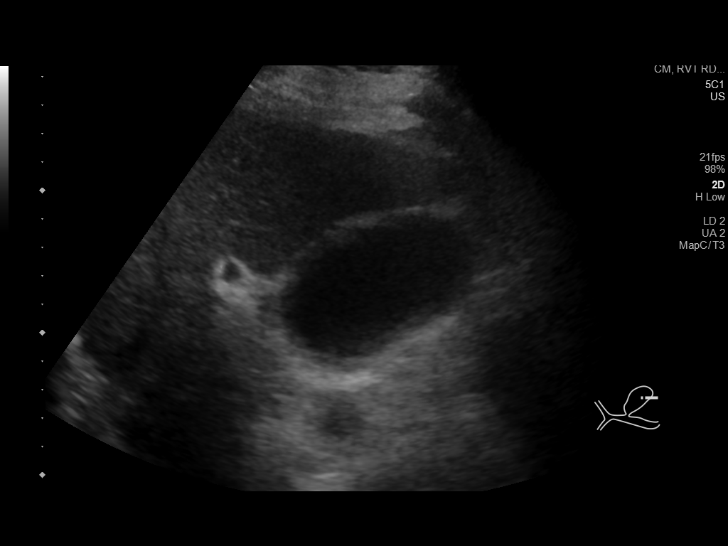
[im 15/58]
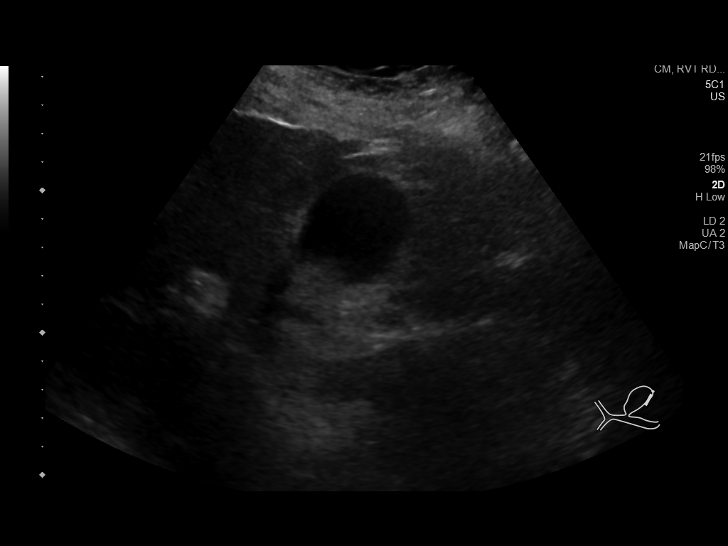
[im 20/58]
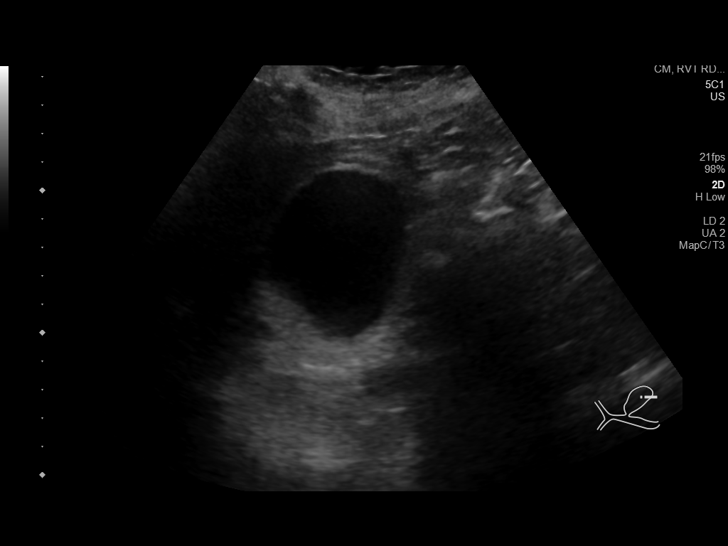
[im 22/58]
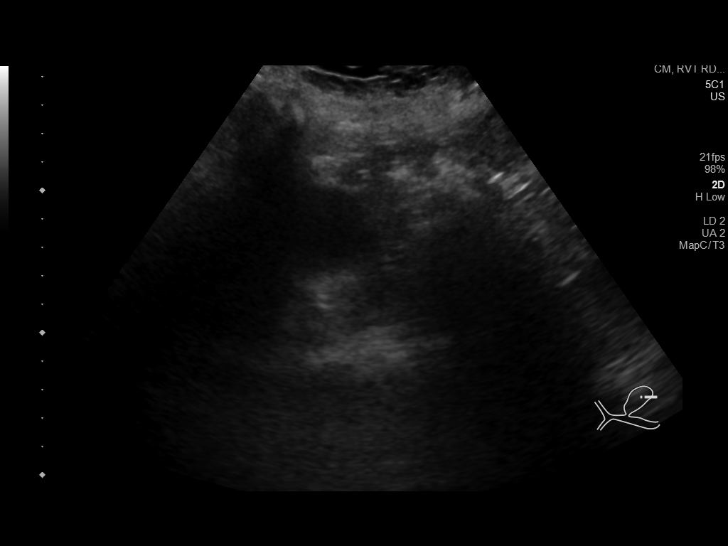
[im 27/58]
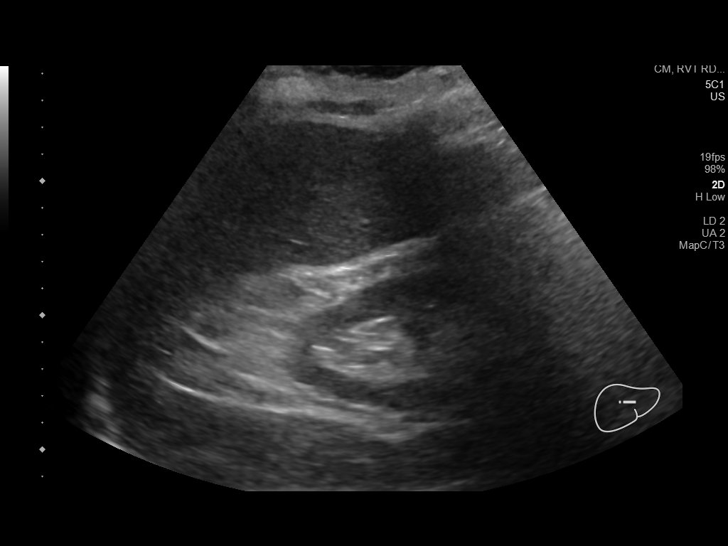
[im 31/58]
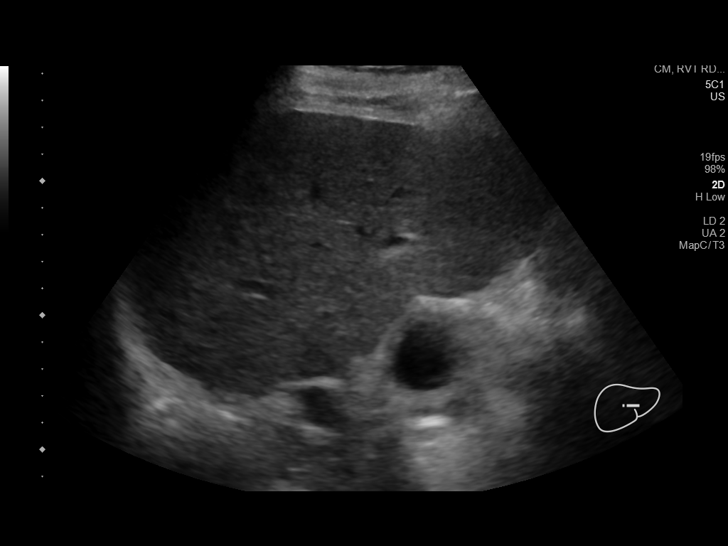
[im 36/58]
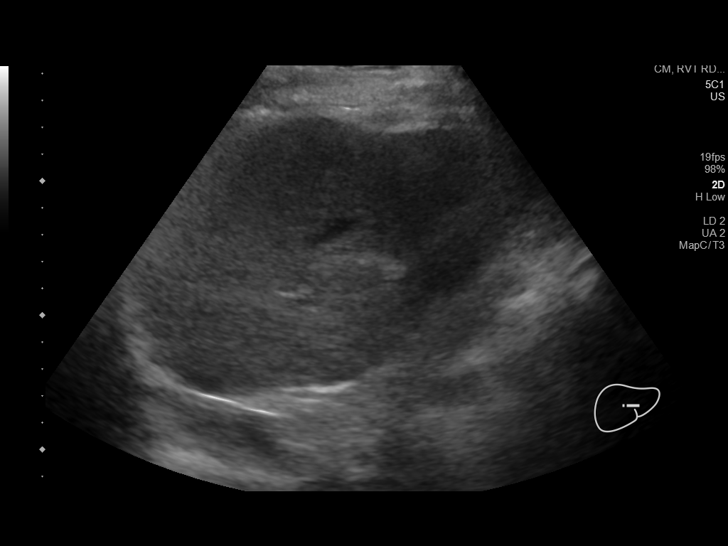
[im 39/58]
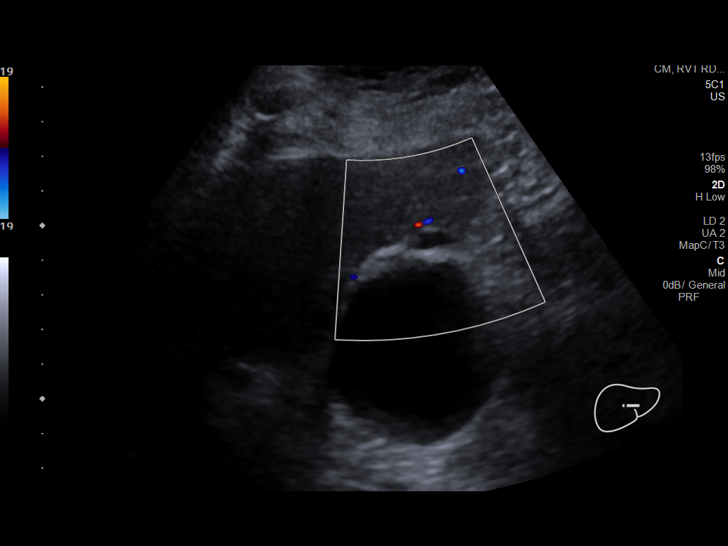
[im 43/58]
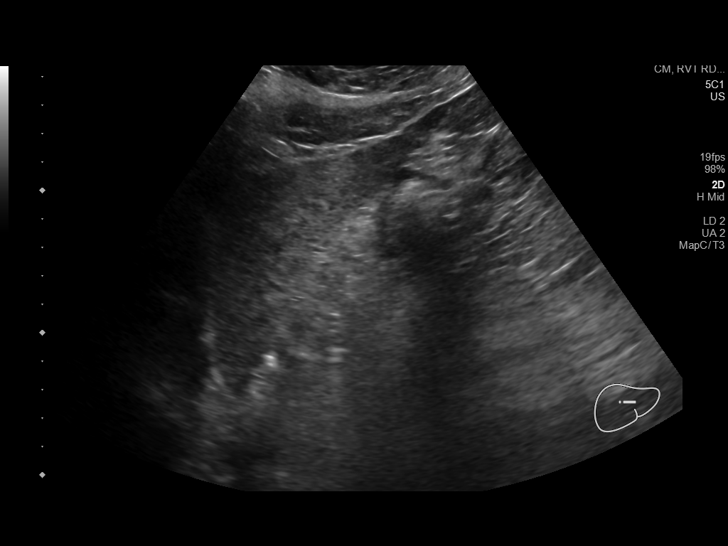
[im 48/58]
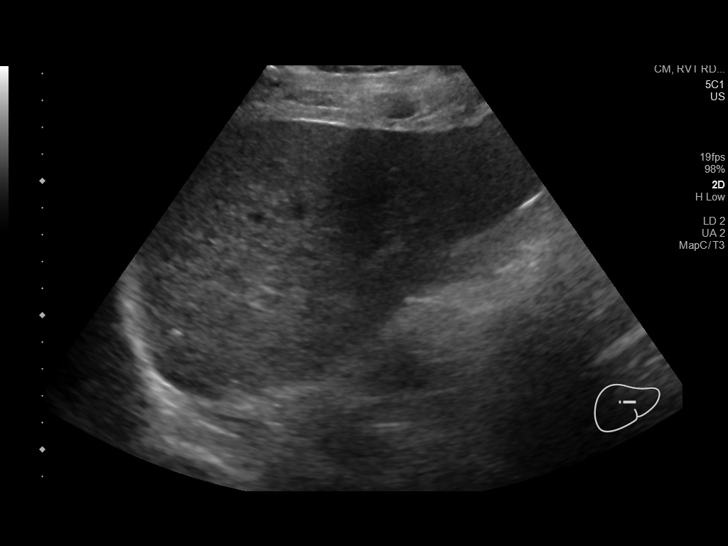
[im 53/58]
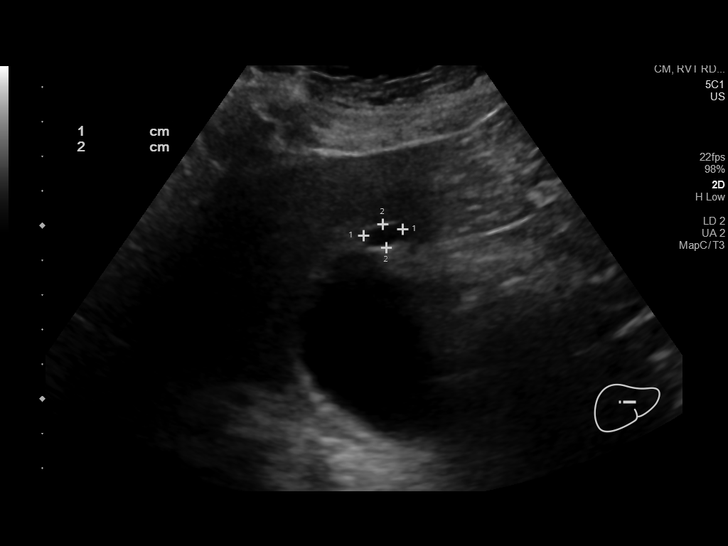
[im 58/58]
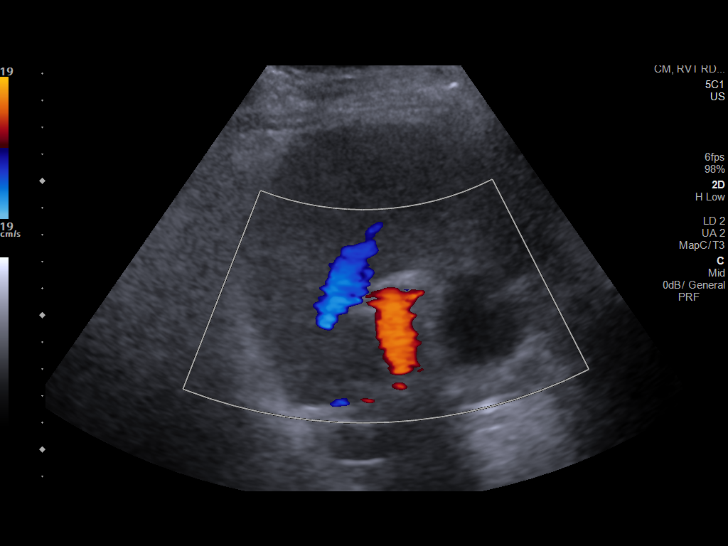

[14 of 25 positions shown; findings below may reference images not displayed]

FINDINGS: Gallbladder:

No gallstones or wall thickening visualized. No sonographic Murphy
sign noted by sonographer.

Common bile duct:

Diameter: Normal at 4 mm

Liver:

No focal solid lesion identified. Similar appearance of faint
nodularity of the hepatic contour and diffuse heterogeneity of the
echotexture. Stable small simple cyst is noted incidentally adjacent
to the gallbladder. The cyst measures 1.1 x 0.7 x 0.9 cm. Portal
vein is patent on color Doppler imaging with normal direction of
blood flow towards the liver.
IMPRESSION: 1. Hepatic cirrhosis without discrete solid hepatic lesion.
2. The main portal vein remains patent with normal directional flow.
